# Patient Record
Sex: Female | Born: 1960 | Race: Black or African American | Hispanic: No | Marital: Married | Smoking: Current some day smoker
Health system: Southern US, Community
[De-identification: ages and names within clinical notes are randomized; demographics above are authoritative.]

## PROBLEM LIST (undated history)

## (undated) DIAGNOSIS — E559 Vitamin D deficiency, unspecified: Secondary | ICD-10-CM

## (undated) DIAGNOSIS — J309 Allergic rhinitis, unspecified: Secondary | ICD-10-CM

## (undated) DIAGNOSIS — E7800 Pure hypercholesterolemia, unspecified: Secondary | ICD-10-CM

## (undated) DIAGNOSIS — J45909 Unspecified asthma, uncomplicated: Secondary | ICD-10-CM

## (undated) DIAGNOSIS — E785 Hyperlipidemia, unspecified: Secondary | ICD-10-CM

## (undated) DIAGNOSIS — K219 Gastro-esophageal reflux disease without esophagitis: Secondary | ICD-10-CM

## (undated) DIAGNOSIS — F419 Anxiety disorder, unspecified: Secondary | ICD-10-CM

## (undated) DIAGNOSIS — E663 Overweight: Secondary | ICD-10-CM

## (undated) DIAGNOSIS — E039 Hypothyroidism, unspecified: Secondary | ICD-10-CM

## (undated) DIAGNOSIS — F32A Depression, unspecified: Secondary | ICD-10-CM

## (undated) HISTORY — DX: Overweight: E66.3

## (undated) HISTORY — DX: Hypothyroidism, unspecified: E03.9

## (undated) HISTORY — DX: Pure hypercholesterolemia, unspecified: E78.00

## (undated) HISTORY — DX: Allergic rhinitis, unspecified: J30.9

## (undated) HISTORY — DX: Hyperlipidemia, unspecified: E78.5

## (undated) HISTORY — PX: ABDOMINAL HYSTERECTOMY: SHX81

## (undated) HISTORY — PX: ECTOPIC PREGNANCY SURGERY: SHX613

## (undated) HISTORY — PX: COLONOSCOPY: SHX174

## (undated) HISTORY — DX: Unspecified asthma, uncomplicated: J45.909

## (undated) HISTORY — DX: Anxiety disorder, unspecified: F41.9

## (undated) HISTORY — PX: UTERINE FIBROID SURGERY: SHX826

## (undated) HISTORY — DX: Depression, unspecified: F32.A

## (undated) HISTORY — DX: Vitamin D deficiency, unspecified: E55.9

---

## 1998-11-23 HISTORY — PX: ECTOPIC PREGNANCY SURGERY: SHX613

## 2000-11-23 DIAGNOSIS — D219 Benign neoplasm of connective and other soft tissue, unspecified: Secondary | ICD-10-CM

## 2000-11-23 HISTORY — DX: Benign neoplasm of connective and other soft tissue, unspecified: D21.9

## 2015-05-13 ENCOUNTER — Emergency Department (INDEPENDENT_AMBULATORY_CARE_PROVIDER_SITE_OTHER)
Admission: EM | Admit: 2015-05-13 | Discharge: 2015-05-13 | Disposition: A | Discharge status: Home / Self Care | Payer: PRIVATE HEALTH INSURANCE | Source: Home / Self Care | Attending: Family Medicine | Admitting: Family Medicine

## 2015-05-13 ENCOUNTER — Encounter (HOSPITAL_COMMUNITY): Payer: Self-pay | Admitting: Emergency Medicine

## 2015-05-13 DIAGNOSIS — J40 Bronchitis, not specified as acute or chronic: Secondary | ICD-10-CM | POA: Diagnosis not present

## 2015-05-13 MED ORDER — IPRATROPIUM-ALBUTEROL 0.5-2.5 (3) MG/3ML IN SOLN
RESPIRATORY_TRACT | Status: AC
  Filled 2015-05-13: qty 3

## 2015-05-13 MED ORDER — ALBUTEROL SULFATE HFA 108 (90 BASE) MCG/ACT IN AERS: 2.0000 | INHALATION_SPRAY | Freq: Four times a day (QID) | RESPIRATORY_TRACT | Status: DC | PRN

## 2015-05-13 MED ORDER — PREDNISONE 50 MG PO TABS: 50.0000 mg | ORAL_TABLET | Freq: Every day | ORAL | Status: DC

## 2015-05-13 MED ORDER — GUAIFENESIN-CODEINE 100-10 MG/5ML PO SOLN: 5.0000 mL | Freq: Every evening | ORAL | Status: DC | PRN

## 2015-05-13 MED ORDER — IPRATROPIUM-ALBUTEROL 0.5-2.5 (3) MG/3ML IN SOLN
3.0000 mL | Freq: Once | RESPIRATORY_TRACT | Status: AC
  Administered 2015-05-13: 3 mL via RESPIRATORY_TRACT

## 2015-05-13 NOTE — Discharge Instructions (Signed)
Thank you for coming in today. Please quit smoking.  Call or go to the emergency room if you get worse, have trouble breathing, have chest pains, or palpitations.   Acute Bronchitis Bronchitis is inflammation of the airways that extend from the windpipe into the lungs (bronchi). The inflammation often causes mucus to develop. This leads to a cough, which is the most common symptom of bronchitis.  In acute bronchitis, the condition usually develops suddenly and goes away over time, usually in a couple weeks. Smoking, allergies, and asthma can make bronchitis worse. Repeated episodes of bronchitis may cause further lung problems.  CAUSES Acute bronchitis is most often caused by the same virus that causes a cold. The virus can spread from person to person (contagious) through coughing, sneezing, and touching contaminated objects. SIGNS AND SYMPTOMS  1. Cough.  2. Fever.  3. Coughing up mucus.  4. Body aches.  5. Chest congestion.  6. Chills.  7. Shortness of breath.  8. Sore throat.  DIAGNOSIS  Acute bronchitis is usually diagnosed through a physical exam. Your health care provider will also ask you questions about your medical history. Tests, such as chest X-rays, are sometimes done to rule out other conditions.  TREATMENT  Acute bronchitis usually goes away in a couple weeks. Oftentimes, no medical treatment is necessary. Medicines are sometimes given for relief of fever or cough. Antibiotic medicines are usually not needed but may be prescribed in certain situations. In some cases, an inhaler may be recommended to help reduce shortness of breath and control the cough. A cool mist vaporizer may also be used to help thin bronchial secretions and make it easier to clear the chest.  HOME CARE INSTRUCTIONS 1. Get plenty of rest.  2. Drink enough fluids to keep your urine clear or pale yellow (unless you have a medical condition that requires fluid restriction). Increasing fluids may help  thin your respiratory secretions (sputum) and reduce chest congestion, and it will prevent dehydration.  3. Take medicines only as directed by your health care provider. 4. If you were prescribed an antibiotic medicine, finish it all even if you start to feel better. 5. Avoid smoking and secondhand smoke. Exposure to cigarette smoke or irritating chemicals will make bronchitis worse. If you are a smoker, consider using nicotine gum or skin patches to help control withdrawal symptoms. Quitting smoking will help your lungs heal faster.  6. Reduce the chances of another bout of acute bronchitis by washing your hands frequently, avoiding people with cold symptoms, and trying not to touch your hands to your mouth, nose, or eyes.  7. Keep all follow-up visits as directed by your health care provider.  SEEK MEDICAL CARE IF: Your symptoms do not improve after 1 week of treatment.  SEEK IMMEDIATE MEDICAL CARE IF:  You develop an increased fever or chills.   You have chest pain.   You have severe shortness of breath.  You have bloody sputum.   You develop dehydration.  You faint or repeatedly feel like you are going to pass out.  You develop repeated vomiting.  You develop a severe headache. MAKE SURE YOU:   Understand these instructions.  Will watch your condition.  Will get help right away if you are not doing well or get worse. Document Released: 12/17/2004 Document Revised: 03/26/2014 Document Reviewed: 05/02/2013 [facility] Patient Information 2015 [place], [place]. This information is not intended to replace advice given to you by your health care provider. Make sure you discuss any questions  you have with your health care provider.    How to Use an Inhaler Proper inhaler technique is very important. Good technique ensures that the medicine reaches the lungs. Poor technique results in depositing the medicine on the tongue and back of the throat rather than in the airways. If you  do not use the inhaler with good technique, the medicine will not help you. STEPS TO FOLLOW IF USING AN INHALER WITHOUT AN EXTENSION TUBE 9. Remove the cap from the inhaler. 10. If you are using the inhaler for the first time, you will need to prime it. Shake the inhaler for 5 seconds and release four puffs into the air, away from your face. Ask your health care provider or pharmacist if you have questions about priming your inhaler. 11. Shake the inhaler for 5 seconds before each breath in (inhalation). 12. Position the inhaler so that the top of the canister faces up. 13. Put your index finger on the top of the medicine canister. Your thumb supports the bottom of the inhaler. 14. Open your mouth. 15. Either place the inhaler between your teeth and place your lips tightly around the mouthpiece, or hold the inhaler 1-2 inches away from your open mouth. If you are unsure of which technique to use, ask your health care provider. 16. Breathe out (exhale) normally and as completely as possible. 17. Press the canister down with your index finger to release the medicine. 18. At the same time as the canister is pressed, inhale deeply and slowly until your lungs are completely filled. This should take 4-6 seconds. Keep your tongue down. 19. Hold the medicine in your lungs for 5-10 seconds (10 seconds is best). This helps the medicine get into the small airways of your lungs. 20. Breathe out slowly, through pursed lips. Whistling is an example of pursed lips. 21. Wait at least 15-30 seconds between puffs. Continue with the above steps until you have taken the number of puffs your health care provider has ordered. Do not use the inhaler more than your health care provider tells you. 22. Replace the cap on the inhaler. 23. Follow the directions from your health care provider or the inhaler insert for cleaning the inhaler. STEPS TO FOLLOW IF USING AN INHALER WITH AN EXTENSION (SPACER) 8. Remove the cap from  the inhaler. 9. If you are using the inhaler for the first time, you will need to prime it. Shake the inhaler for 5 seconds and release four puffs into the air, away from your face. Ask your health care provider or pharmacist if you have questions about priming your inhaler. 10. Shake the inhaler for 5 seconds before each breath in (inhalation). 11. Place the open end of the spacer onto the mouthpiece of the inhaler. 12. Position the inhaler so that the top of the canister faces up and the spacer mouthpiece faces you. 13. Put your index finger on the top of the medicine canister. Your thumb supports the bottom of the inhaler and the spacer. 14. Breathe out (exhale) normally and as completely as possible. 15. Immediately after exhaling, place the spacer between your teeth and into your mouth. Close your lips tightly around the spacer. 16. Press the canister down with your index finger to release the medicine. 17. At the same time as the canister is pressed, inhale deeply and slowly until your lungs are completely filled. This should take 4-6 seconds. Keep your tongue down and out of the way. 18. Hold the medicine in your  lungs for 5-10 seconds (10 seconds is best). This helps the medicine get into the small airways of your lungs. Exhale. 19. Repeat inhaling deeply through the spacer mouthpiece. Again hold that breath for up to 10 seconds (10 seconds is best). Exhale slowly. If it is difficult to take this second deep breath through the spacer, breathe normally several times through the spacer. Remove the spacer from your mouth. 20. Wait at least 15-30 seconds between puffs. Continue with the above steps until you have taken the number of puffs your health care provider has ordered. Do not use the inhaler more than your health care provider tells you. 21. Remove the spacer from the inhaler, and place the cap on the inhaler. 22. Follow the directions from your health care provider or the inhaler insert  for cleaning the inhaler and spacer. If you are using different kinds of inhalers, use your quick relief medicine to open the airways 10-15 minutes before using a steroid if instructed to do so by your health care provider. If you are unsure which inhalers to use and the order of using them, ask your health care provider, nurse, or respiratory therapist. If you are using a steroid inhaler, always rinse your mouth with water after your last puff, then gargle and spit out the water. Do not swallow the water. AVOID:  Inhaling before or after starting the spray of medicine. It takes practice to coordinate your breathing with triggering the spray.  Inhaling through the nose (rather than the mouth) when triggering the spray. HOW TO DETERMINE IF YOUR INHALER IS FULL OR NEARLY EMPTY You cannot know when an inhaler is empty by shaking it. A few inhalers are now being made with dose counters. Ask your health care provider for a prescription that has a dose counter if you feel you need that extra help. If your inhaler does not have a counter, ask your health care provider to help you determine the date you need to refill your inhaler. Write the refill date on a calendar or your inhaler canister. Refill your inhaler 7-10 days before it runs out. Be sure to keep an adequate supply of medicine. This includes making sure it is not expired, and that you have a spare inhaler.  SEEK MEDICAL CARE IF:   Your symptoms are only partially relieved with your inhaler.  You are having trouble using your inhaler.  You have some increase in phlegm. SEEK IMMEDIATE MEDICAL CARE IF:   You feel little or no relief with your inhalers. You are still wheezing and are feeling shortness of breath or tightness in your chest or both.  You have dizziness, headaches, or a fast heart rate.  You have chills, fever, or night sweats.  You have a noticeable increase in phlegm production, or there is blood in the phlegm. MAKE SURE YOU:     Understand these instructions.  Will watch your condition.  Will get help right away if you are not doing well or get worse. Document Released: 11/06/2000 Document Revised: 08/30/2013 Document Reviewed: 06/08/2013 [facility] Patient Information 2015 [place], [place]. This information is not intended to replace advice given to you by your health care provider. Make sure you discuss any questions you have with your health care provider.

## 2015-05-13 NOTE — ED Notes (Signed)
Pt has been suffering from chest congestion and a cough for one week.  She has tried several OTC medications with no relief.  She denies any other symptoms.

## 2015-05-13 NOTE — ED Provider Notes (Signed)
Melanie Newton is a 54 y.o. female who presents to Urgent Care today for chest congestion. Patient notes a one-week history of cough congestion wheezing shortness of breath. She's tried some over-the-counter medicines which have not helped. No fevers chills nausea vomiting or diarrhea.   History reviewed. No pertinent past medical history. History reviewed. No pertinent past surgical history. History  Substance Use Topics  . Smoking status: Never Smoker   . Smokeless tobacco: Not on file  . Alcohol Use: Yes     Comment: occasional   patient smokes marijuana daily   ROS as above Medications: No current facility-administered medications for this encounter.   Current Outpatient Prescriptions  Medication Sig Dispense Refill  . albuterol (PROVENTIL HFA;VENTOLIN HFA) 108 (90 BASE) MCG/ACT inhaler Inhale 2 puffs into the lungs every 6 (six) hours as needed for wheezing or shortness of breath. 1 Inhaler 2  . guaiFENesin-codeine 100-10 MG/5ML syrup Take 5 mLs by mouth at bedtime as needed for cough. 120 mL 0  . predniSONE (DELTASONE) 50 MG tablet Take 1 tablet (50 mg total) by mouth daily. 5 tablet 0   No Known Allergies   Exam:  BP 147/105 mmHg  Pulse 70  Temp(Src) 98.9 F (37.2 C) (Oral)  SpO2 100% Gen: Well NAD HEENT: EOMI,  MMM Lungs: Normal work of breathing. Prolonged expiratory phase and wheezing bilaterally Heart: RRR no MRG Abd: NABS, Soft. Nondistended, Nontender Exts: Brisk capillary refill, warm and well perfused.   Patient was given a 2.5/0.5 mg DuoNeb nebulizer treatment, and felt better  No results found for this or any previous visit (from the past 24 hour(s)). No results found.  Assessment and Plan: 54 y.o. female with bronchitis. Treat with prednisone and albuterol and codeine cough syrup. Encouraged smoking cessation.  Discussed warning signs or symptoms. Please see discharge instructions. Patient expresses understanding.     Gregor Hams,  MD 05/13/15 2020

## 2016-05-28 ENCOUNTER — Other Ambulatory Visit: Payer: Self-pay | Admitting: Nurse Practitioner

## 2016-05-28 DIAGNOSIS — Z1231 Encounter for screening mammogram for malignant neoplasm of breast: Secondary | ICD-10-CM

## 2016-06-12 ENCOUNTER — Ambulatory Visit
Admission: RE | Admit: 2016-06-12 | Discharge: 2016-06-12 | Disposition: A | Discharge status: Home / Self Care | Payer: BLUE CROSS/BLUE SHIELD | Source: Ambulatory Visit | Attending: Nurse Practitioner | Admitting: Nurse Practitioner

## 2016-06-12 ENCOUNTER — Other Ambulatory Visit: Payer: Self-pay | Admitting: Nurse Practitioner

## 2016-06-12 DIAGNOSIS — Z1231 Encounter for screening mammogram for malignant neoplasm of breast: Secondary | ICD-10-CM | POA: Diagnosis present

## 2017-09-30 ENCOUNTER — Other Ambulatory Visit: Payer: Self-pay | Admitting: Nurse Practitioner

## 2017-09-30 DIAGNOSIS — Z1231 Encounter for screening mammogram for malignant neoplasm of breast: Secondary | ICD-10-CM

## 2017-11-17 ENCOUNTER — Ambulatory Visit
Admission: RE | Admit: 2017-11-17 | Discharge: 2017-11-17 | Disposition: A | Discharge status: Home / Self Care | Payer: BLUE CROSS/BLUE SHIELD | Source: Ambulatory Visit | Attending: Nurse Practitioner | Admitting: Nurse Practitioner

## 2017-11-17 DIAGNOSIS — Z1231 Encounter for screening mammogram for malignant neoplasm of breast: Secondary | ICD-10-CM | POA: Diagnosis present

## 2018-09-21 ENCOUNTER — Other Ambulatory Visit: Payer: Self-pay | Admitting: Nurse Practitioner

## 2018-09-21 DIAGNOSIS — Z1231 Encounter for screening mammogram for malignant neoplasm of breast: Secondary | ICD-10-CM

## 2018-11-21 ENCOUNTER — Ambulatory Visit
Admission: RE | Admit: 2018-11-21 | Discharge: 2018-11-21 | Disposition: A | Discharge status: Home / Self Care | Payer: BLUE CROSS/BLUE SHIELD | Source: Ambulatory Visit | Attending: Nurse Practitioner | Admitting: Nurse Practitioner

## 2018-11-21 DIAGNOSIS — Z1231 Encounter for screening mammogram for malignant neoplasm of breast: Secondary | ICD-10-CM | POA: Diagnosis present

## 2019-11-06 ENCOUNTER — Other Ambulatory Visit: Payer: Self-pay | Admitting: Nurse Practitioner

## 2019-11-06 DIAGNOSIS — Z1231 Encounter for screening mammogram for malignant neoplasm of breast: Secondary | ICD-10-CM

## 2020-02-02 ENCOUNTER — Ambulatory Visit
Admission: RE | Admit: 2020-02-02 | Discharge: 2020-02-02 | Disposition: A | Discharge status: Home / Self Care | Payer: BLUE CROSS/BLUE SHIELD | Source: Ambulatory Visit | Attending: Nurse Practitioner | Admitting: Nurse Practitioner

## 2020-02-02 DIAGNOSIS — Z1231 Encounter for screening mammogram for malignant neoplasm of breast: Secondary | ICD-10-CM | POA: Diagnosis not present

## 2020-07-25 ENCOUNTER — Telehealth: Payer: Self-pay | Admitting: General Practice

## 2020-07-25 NOTE — Telephone Encounter (Signed)
  Called patient to schedule an appointment in regards to a referral we received. *I was unable to leave voice mail. * Referral is attached for reference.

## 2020-09-06 ENCOUNTER — Other Ambulatory Visit: Payer: Self-pay

## 2020-09-06 ENCOUNTER — Ambulatory Visit (INDEPENDENT_AMBULATORY_CARE_PROVIDER_SITE_OTHER): Payer: PRIVATE HEALTH INSURANCE | Admitting: Obstetrics and Gynecology

## 2020-09-06 ENCOUNTER — Encounter: Payer: Self-pay | Admitting: Obstetrics and Gynecology

## 2020-09-06 ENCOUNTER — Other Ambulatory Visit (HOSPITAL_COMMUNITY)
Admission: RE | Admit: 2020-09-06 | Discharge: 2020-09-06 | Disposition: A | Discharge status: Home / Self Care | Payer: PRIVATE HEALTH INSURANCE | Source: Ambulatory Visit | Attending: Obstetrics and Gynecology | Admitting: Obstetrics and Gynecology

## 2020-09-06 VITALS — BP 93/61 | HR 71 | Ht 66.0 in | Wt 166.1 lb

## 2020-09-06 DIAGNOSIS — R102 Pelvic and perineal pain unspecified side: Secondary | ICD-10-CM

## 2020-09-06 DIAGNOSIS — D259 Leiomyoma of uterus, unspecified: Secondary | ICD-10-CM

## 2020-09-06 DIAGNOSIS — Z124 Encounter for screening for malignant neoplasm of cervix: Secondary | ICD-10-CM | POA: Insufficient documentation

## 2020-09-06 DIAGNOSIS — R87615 Unsatisfactory cytologic smear of cervix: Secondary | ICD-10-CM | POA: Diagnosis not present

## 2020-09-06 NOTE — Progress Notes (Signed)
New pt present for a referral for fibroid pain and issues. Pt c/o of sharp pain and tender/sorenenss in the abd area.

## 2020-09-06 NOTE — Progress Notes (Signed)
GYNECOLOGY CLINIC PROGRESS NOTE Subjective:    Melanie Newton is a 59 y.o. [phone] menopausal female who presents as a referral from PCP Margurite Auerbach, NP) with uterine fibroids and complaints of pelvic pain x 1 year. Patient states that she has known about her fibroids since 2002.  Utilized Depo Provera for management of her cycles until menopause. Has not had any issues with postmenopausal bleeding, however has noted some pelvic discomfort that has been ongoing for the past year. Pain radiates across the lower pelvis every few days. Manages by taking NSAIDs/Tylenol as needed. Also notes abdominal soreness if she sleeps on her stomach.    Gynecologic History: Current contraception: post menopausal status History of abnormal Pap smear: no.  She does report that last pap smear in August with annual exam by PCP was unsatisfactory and needed repeat.  Family history of uterine or ovarian cancer: no Regular self breast exam: yes History of abnormal mammogram: no Family history of breast cancer: no Last colonoscopy: 08/25/2017   OB History  Gravida Para Term Preterm AB Living  5 1 1   4 1   SAB TAB Ectopic Multiple Live Births  3   1   1     # Outcome Date GA Lbr Len/2nd Weight Sex Delivery Anes PTL Lv  5 Ectopic 2000     ECTOPIC     4 Term 06/25/84    M Vag-Spont EPI  LIV  3 SAB      SAB     2 SAB      SAB     1 SAB      SAB       Past Medical History:  Diagnosis Date  . Allergic rhinitis   . Anxiety   . Depression   . High cholesterol   . Hyperlipidemia   . Hypothyroidism   . Vitamin D deficiency     Past Surgical History:  Procedure Laterality Date  . ECTOPIC PREGNANCY SURGERY    . UTERINE FIBROID SURGERY      Family History  Problem Relation Age of Onset  . Breast cancer Cousin 42  . Breast cancer Maternal Aunt 58  . Lupus Other   . Pancreatic cancer Other   . Hypertension Other   . Diabetes Other     Social History   Socioeconomic History  .  Marital status: Married    Spouse name: Not on file  . Number of children: Not on file  . Years of education: Not on file  . Highest education level: Not on file  Occupational History  . Not on file  Tobacco Use  . Smoking status: Never Smoker  . Smokeless tobacco: Never Used  Vaping Use  . Vaping Use: Never used  Substance and Sexual Activity  . Alcohol use: Yes    Comment: occasional  . Drug use: No  . Sexual activity: Not Currently  Other Topics Concern  . Not on file  Social History Narrative  . Not on file   Social Determinants of Health   Financial Resource Strain:   . Difficulty of Paying Living Expenses: Not on file  Food Insecurity:   . Worried About Charity fundraiser in the Last Year: Not on file  . Ran Out of Food in the Last Year: Not on file  Transportation Needs:   . Lack of Transportation (Medical): Not on file  . Lack of Transportation (Non-Medical): Not on file  Physical Activity:   . Days of Exercise  per Week: Not on file  . Minutes of Exercise per Session: Not on file  Stress:   . Feeling of Stress : Not on file  Social Connections:   . Frequency of Communication with Friends and Family: Not on file  . Frequency of Social Gatherings with Friends and Family: Not on file  . Attends Religious Services: Not on file  . Active Member of Clubs or Organizations: Not on file  . Attends Archivist Meetings: Not on file  . Marital Status: Not on file  Intimate Partner Violence:   . Fear of Current or Ex-Partner: Not on file  . Emotionally Abused: Not on file  . Physically Abused: Not on file  . Sexually Abused: Not on file    Current Outpatient Medications on File Prior to Visit  Medication Sig Dispense Refill  . albuterol (PROVENTIL HFA;VENTOLIN HFA) 108 (90 BASE) MCG/ACT inhaler Inhale 2 puffs into the lungs every 6 (six) hours as needed for wheezing or shortness of breath. 1 Inhaler 2  . atorvastatin (LIPITOR) 40 MG tablet Take 40 mg by  mouth daily.    . citalopram (CELEXA) 20 MG tablet Take 20 mg by mouth daily. Take 1 and 1/2 tablet daily.    Marland Kitchen loratadine (CLARITIN) 10 MG tablet Take 10 mg by mouth daily as needed for allergies.     . montelukast (SINGULAIR) 10 MG tablet Take 10 mg by mouth at bedtime. (Patient not taking: Reported on 09/06/2020)     No current facility-administered medications on file prior to visit.    No Known Allergies    Review of Systems Constitutional: negative for chills, fatigue, fevers and sweats Eyes: negative for irritation, redness and visual disturbance Ears, nose, mouth, throat, and face: negative for hearing loss, nasal congestion, snoring and tinnitus Respiratory: negative for asthma, cough, sputum Cardiovascular: negative for chest pain, dyspnea, exertional chest pressure/discomfort, irregular heart beat, palpitations and syncope Gastrointestinal: negative for abdominal pain, change in bowel habits, nausea and vomiting Genitourinary: negative for abnormal menstrual periods, genital lesions, sexual problems and vaginal discharge, dysuria and urinary incontinence. Positive for pelvic pain and urinary urgency.  Integument/breast: negative for breast lump, breast tenderness and nipple discharge Hematologic/lymphatic: negative for bleeding and easy bruising Musculoskeletal:negative for back pain and muscle weakness Neurological: negative for dizziness, headaches, vertigo and weakness Endocrine: negative for diabetic symptoms including polydipsia, polyuria and skin dryness Allergic/Immunologic: negative for hay fever and urticaria      Objective:     BP 93/61   Pulse 71   Ht 5\' 6"  (1.676 m)   Wt 166 lb 1.6 oz (75.3 kg)   LMP  (LMP Unknown)   BMI 26.81 kg/m  General appearance: alert and no distress Abdomen: soft, non-tender; bowel sounds normal; no masses,  no organomegaly Pelvic: external genitalia normal, rectovaginal septum normal.  Vagina without discharge.  Cervix normal  appearing, no lesions and no motion tenderness.  Uterus mobile, nontender, enlarged (14-16 week sized, irregular contour).  Adnexae non-palpable, nontender bilaterally.  Extremities: extremities normal, atraumatic, no cyanosis or edema Neurologic: Grossly normal      Imaging (performed at [facility] in [place], 12/26/2018):  Impression:  1. The Lung bases are clear.  2. Liver, spleen, pancreas, adrenal glands, kidneys appear normal.  3. Enlarged uterus with multiple fibroids present. No adnexal masses.  No free fluid or abnormal fluid collections. 4. No evidence of inflammatory disease. No bowel abnormalitiy   Assessment:    Symptomatic uterine fibroids.  Pelvic pain  Unsatisfactory pap smear  Plan:   1. Symptomatic fibroids - patient with uterine enlargement, multiple fibroids noted on CT scan, however further deliniation needed.  Discussed further imaging with pelvic ultrasound and to reassess fibroids as it has been over a year since last imaging. Patient to return in 1-2 weeks for ultrasound.  2.  Discussed options for management of pelvic pain due to fibroids (expectant management vs UFE vs hysterectomy). Discussed risks and benefits of each. Patient opts for expectant management at this time, as she notes symptoms are manageable with OTC meds. 3. Patient reports recent unsatisfactory pap smear, repeat performed today.    A total of 30 minutes were spent face-to-face with the patient during the encounter with greater than 50% dealing with review of records and imaging, counseling and coordination of care.    Rubie Maid, MD [facility]

## 2020-09-06 NOTE — Patient Instructions (Signed)

## 2020-09-12 ENCOUNTER — Encounter: Payer: PRIVATE HEALTH INSURANCE | Admitting: Obstetrics and Gynecology

## 2020-09-13 LAB — CYTOLOGY - PAP
Comment: NEGATIVE
Diagnosis: NEGATIVE
High risk HPV: NEGATIVE

## 2020-09-20 ENCOUNTER — Encounter: Payer: PRIVATE HEALTH INSURANCE | Admitting: Obstetrics and Gynecology

## 2020-09-25 ENCOUNTER — Other Ambulatory Visit: Payer: Self-pay

## 2020-09-25 ENCOUNTER — Ambulatory Visit (INDEPENDENT_AMBULATORY_CARE_PROVIDER_SITE_OTHER): Payer: PRIVATE HEALTH INSURANCE

## 2020-09-25 DIAGNOSIS — R102 Pelvic and perineal pain: Secondary | ICD-10-CM | POA: Diagnosis not present

## 2020-09-25 DIAGNOSIS — D259 Leiomyoma of uterus, unspecified: Secondary | ICD-10-CM

## 2020-10-07 ENCOUNTER — Telehealth: Payer: Self-pay

## 2020-10-07 NOTE — Telephone Encounter (Signed)
Please schedule the pt for a televisit or an in office visit. Thanks [employer]

## 2020-10-07 NOTE — Telephone Encounter (Signed)
Patient called in stating that she would like to speak with her provider regarding getting her fibroids removed. Patient would like to speak with her provider regarding the next steps and what all that entails. Could you please advise?

## 2020-10-07 NOTE — Telephone Encounter (Signed)
Patient has been scheduled

## 2020-10-10 ENCOUNTER — Encounter: Payer: Self-pay | Admitting: Obstetrics and Gynecology

## 2020-10-10 ENCOUNTER — Other Ambulatory Visit: Payer: Self-pay

## 2020-10-10 ENCOUNTER — Ambulatory Visit (INDEPENDENT_AMBULATORY_CARE_PROVIDER_SITE_OTHER): Payer: PRIVATE HEALTH INSURANCE | Admitting: Obstetrics and Gynecology

## 2020-10-10 VITALS — BP 117/73 | HR 67 | Ht 66.0 in | Wt 164.8 lb

## 2020-10-10 DIAGNOSIS — D252 Subserosal leiomyoma of uterus: Secondary | ICD-10-CM

## 2020-10-10 DIAGNOSIS — R102 Pelvic and perineal pain: Secondary | ICD-10-CM

## 2020-10-10 DIAGNOSIS — D251 Intramural leiomyoma of uterus: Secondary | ICD-10-CM | POA: Diagnosis not present

## 2020-10-10 NOTE — Progress Notes (Signed)
GYNECOLOGY PROGRESS NOTE  Subjective:    Patient ID: Melanie Newton, female    DOB: Sep 23, 1961, 59 y.o.   MRN: [id number]  HPI  Patient is a 59 y.o. [id number] postmenopausal female who presents for discussion of surgical management of her fibroids. She notes she is still having some pain and discomfort, and thinks she is finally ready to proceed with hysterectomy.  She states that she has been dealing with fibroids for over 20 years, and has had various treatments including hormones (birth control, Depot Lupron, surgery with hysteroscopic resection). Is ready to finally be done with the fibroids.   The following portions of the patient's history were reviewed and updated as appropriate: allergies, current medications, past family history, past medical history, past social history, past surgical history and problem list.  Review of Systems Pertinent items noted in HPI and remainder of comprehensive ROS otherwise negative.   Objective:   Blood pressure 117/73, pulse 67, height 5\' 6"  (1.676 m), weight 164 lb 12.8 oz (74.8 kg). General appearance: alert and no distress See H&P for remainder of exam.     Imaging:  US PELVIS (TRANSABDOMINAL ONLY) Patient Name: Melanie Newton DOB: [birth year] MRN: [id number] ULTRASOUND REPORT  Location: [facility] Date of Service: 09/26/2020   Indications:Enlarged Uterus, history of fibroids  Findings:  The uterus is anteverted and measures 9.2 x 6.0 x 6.2 cm.. Echo texture is heterogenous with evidence of focal masses. Within the uterus are multiple suspected fibroids measuring: Fibroid 1:Posterior SS 3.4 x 3.6 x 3.6 cm Fibroid 2:Anterior SS 3.5 x 4.0 x 4.2 cm. Fibroid 3: Lower body IM 3.1 x 2.2 x 2.9 cm. Fibroid 4: Anterior IM 3.5 x 3.1 x 3.1 cm.  The Endometrium measures 5 mm.  Right Ovary not seen .  Left Ovary measures 2.8 x 2.6 x 2.8 cm  It is normal in  appearance.Hypoechoic lesion with smooth wall and good through    transmission measuring 2.1 x 1.7 x 1.6 cm. Survey of the adnexa demonstrates no adnexal masses. There is no free fluid in the cul de sac.  Impression: 1. Enlarge fibroid uterus. 2. Lt ovarian simple cyst.  Recommendations: 1.Clinical correlation with the patient's History and Physical Exam.  Jenine M. Albertine Grates    RDMS  I have reviewed this study and agree with documented findings.   Rubie Maid, MD [facility]   Assessment:   1. Intramural and subserous leiomyoma of uterus   2. Pelvic pain     Plan:   Patient desires definitive management with hysterectomy. I proposed doing a robotic-assisted total laparoscopic hysterectomy and prophylactic bilateral salpingectomy.  No indication for oophorectomy.  Patient agrees with this proposed surgery.  The risks of surgery were discussed in detail with the patient including but not limited to: bleeding which may require transfusion or reoperation; infection which may require antibiotics; injury to bowel, bladder, ureters or other surrounding organs; need for additional procedures including laparotomy or subsequent procedures secondary to abnormal pathology; formation of adhesions; thromboembolic phenomenon; incisional problems and other postoperative/anesthesia complications.  Patient was also advised that she will remain in house for up to 24 hours; and expected recovery time after a hysterectomy is 6-8 weeks.  Patient was told that the likelihood that her condition and symptoms will be treated effectively with this surgical management was very high; the postoperative expectations were also discussed in detail. The patient also understands the alternative treatment options which were discussed in full. All questions were answered.  She was told that she will be contacted by our surgical scheduler regarding the time and date of her surgery; routine preoperative instructions will be given to her by the preoperative nursing team.   She is  aware of need for preoperative COVID testing and subsequent quarantine from time of test to time of surgery; she will be given further preoperative instructions at that [facility] screening visit.   Routine postoperative instructions will be reviewed with the patient in detail after surgery. Printed patient education handouts about the procedure was given to the patient to review at home. Desires to have surgery soon.  Will tentatively schedule surgery for 10/25/2020.    A total of 15 minutes were spent face-to-face with the patient during this encounter and over half of that time dealt with counseling and coordination of care.   Rubie Maid, MD [facility]

## 2020-10-10 NOTE — Patient Instructions (Signed)
You are scheduled for surgery on 10/25/2020 for Robotic Total hysterectomy.  Nothing to eat after midnight on day prior to surgery.  Do not take any medications unless recommended by your provider on day prior to surgery.  Do not take NSAIDs (Motrin, Aleve) or aspirin 7 days prior to surgery.  You may take Tylenol products for minor aches and pains.  You will receive a prescription for pain medications post-operatively.  You will be contacted by phone approximately 1 week prior to surgery to schedule pre-operative appointment. Please call the office if you have any questions regarding your upcoming surgery.     Preparing for Surgery Preparing for surgery is an important part of your care. It can make things go more smoothly and help you avoid complications. The steps leading up to surgery may vary among hospitals. Follow all instructions given to you by your health care providers. Ask questions if you do not understand something. Talk about any concerns that you have. Here are some questions to consider asking before your surgery:  If my surgery is not an emergency (is elective), when would be the best time to have the surgery?  What arrangements do I need to make for work, home, or school?  What will my recovery be like? How long will it be before I can return to normal activities?  Will I need to prepare my home? Will I need to arrange care for me or my children?  Should I expect to have pain after surgery? What are my pain management options? Are there nonmedical options that I can try for pain? Tell a health care provider about:   Any allergies you have.  All medicines you are taking, including vitamins, herbs, eye drops, creams, and over-the-counter medicines.  Any problems you or family members have had with anesthetic medicines.  Any blood disorders you have.  Any surgeries you have had.  Any medical conditions you have.  Whether you are pregnant or may be pregnant. What  are the risks? The risks and complications of surgery depend on the specific procedure that you have. Discuss all the risks with your health care providers before your surgery. Ask about common surgical complications, which may include:  Infection.  Bleeding or a need for blood replacement (transfusion).  Allergic reactions to medicines.  Damage to surrounding nerves, tissues, or structures.  A blood clot.  Scarring.  Failure of the surgery to correct the problem. Follow these instructions before the procedure: Several days or weeks before your procedure  You may have a physical exam by your primary health care provider to make sure it is safe for you to have surgery.  You may have testing. This may include a chest X-ray, blood and urine tests, electrocardiogram (ECG), or other testing.  Ask your health care provider about: ? Changing or stopping your regular medicines. This is especially important if you are taking diabetes medicines or blood thinners. ? Taking medicines such as aspirin and ibuprofen. These medicines can thin your blood. Do not take these medicines unless your health care provider tells you to take them. ? Taking over-the-counter medicines, vitamins, herbs, and supplements.  Do not use any products that contain nicotine or tobacco, such as cigarettes and e-cigarettes. If you need help quitting, ask your health care provider.  Avoid alcohol, or limit your alcohol intake to no more than 1 drink a day for nonpregnant women and 2 drinks a day for men. One drink equals 12 oz of beer, 5 oz  of wine, or 1 oz of hard liquor.  Ask your health care provider if there are exercises you can do to prepare for surgery.  Eat a healthy diet. A healthy diet includes low-fat dairy products, low-fat (lean) meats, and fiber from whole grains, beans, and lots of fruits and vegetables.  Plan to have someone take you home from the hospital or clinic.  Plan to have a responsible adult  care for you for at least 24 hours after you leave the hospital or clinic. This is important. The day before your procedure  You may be given antibiotic medicine to take by mouth to help prevent infection. Take it as told by your health care provider.  You may be asked to shower with a germ-killing soap.  Follow instructions from your health care provider about eating and drinking restrictions. The day of your procedure  You may need to take another shower with a germ-killing soap before you leave home in the morning.  With a small sip of water, take only the medicines that you are told to take.  Do not wear any makeup, nail polish, powder, deodorant, lotion, jewelry, hair accessories, or anything on your skin or body except your clothes.  If you will be staying in the hospital, bring a case to hold your glasses, contacts, or dentures. You may also want to bring your robe and non-skid footwear.  If instructed by your health care provider, bring your sleep apnea device with you on the day of your surgery (if this applies to you).  Arrive at the hospital as scheduled.  Bring a friend or family member with you who can help to answer questions and be present while you meet with your health care provider. At the hospital  When you arrive at the hospital, you will likely: ? Go to the reception desk to notify staff that you have arrived. ? Move to the preoperative and preparation areas before going to the operating room.  You may have to wear compression stockings. These help to prevent blood clots and reduce swelling in your legs.  An IV may be inserted into one of your veins.  In the operating room, you may be given one or more of the following: ? A medicine to help you relax (sedative). ? A medicine to numb the area (local anesthetic). ? A medicine to make you fall asleep (general anesthetic). ? A medicine that is injected into an area of your body to numb everything below the  injection site (regional anesthetic).  Your surgical site will be marked or identified.  You may be given an antibiotic through your IV to help prevent infection. Contact a health care provider if you:  Develop a fever of more than 100.22F (38C) or other feelings of illness during the 48 hours before your surgery.  Have symptoms that get worse.  Have questions or concerns about your surgery. Summary  Preparing for surgery can make the procedure go more smoothly and lower your risk of complications.  Before surgery, make a list of questions and concerns to discuss with your surgeon. Ask about the risks and possible complications.  In the days or weeks before your surgery, follow all instructions from your health care provider. You may need to stop smoking, avoid alcohol, follow eating restrictions, and change or stop your regular medicines.  Contact your surgeon if you develop a fever or other signs of illness during the few days before your surgery. This information is not intended to  replace advice given to you by your health care provider. Make sure you discuss any questions you have with your health care provider. Document Revised: 11/12/2017 Document Reviewed: 09/14/2017 Elsevier Patient Education  2020 [employer].

## 2020-10-10 NOTE — Progress Notes (Signed)
Pt present to discuss fibroid removal/surgery. Pt stated that she is having pain and discomfort from the fibroids and would like to discuss options.

## 2020-10-13 ENCOUNTER — Encounter: Payer: Self-pay | Admitting: Obstetrics and Gynecology

## 2020-10-13 NOTE — H&P (View-Only) (Signed)
GYNECOLOGY PREOPERATIVE HISTORY AND PHYSICAL   Subjective:  Melanie Newton is a 59 y.o. [phone] here for surgical management of fibroid uterus and pelvic pain.  No significant preoperative concerns.  Proposed surgery: Robotic Total Hysterectomy with Bilateral Salpingectomy   Pertinent Gynecological History: Menses: post-menopausal Last mammogram: normal Date: 02/02/2020 Last pap: normal Date: 09/06/2020   OB History  Gravida Para Term Preterm AB Living  5 1 1   4 1   SAB TAB Ectopic Multiple Live Births  3   1   1     # Outcome Date GA Lbr Len/2nd Weight Sex Delivery Anes PTL Lv  5 Ectopic 2000     ECTOPIC     4 Term 06/25/84    M Vag-Spont EPI  LIV  3 SAB      SAB     2 SAB      SAB     1 SAB      SAB       Past Medical History:  Diagnosis Date  . Allergic rhinitis   . Anxiety   . Asthma   . Depression   . Fibroids 2002  . High cholesterol   . Hyperlipidemia   . Hypothyroidism   . Vitamin D deficiency     Past Surgical History:  Procedure Laterality Date  . ECTOPIC PREGNANCY SURGERY    . UTERINE FIBROID SURGERY      Family History  Problem Relation Age of Onset  . Breast cancer Cousin 2  . Breast cancer Maternal Aunt 58  . Lupus Other   . Pancreatic cancer Other   . Hypertension Other   . Diabetes Other   . Cancer Mother     Social History   Socioeconomic History  . Marital status: Married    Spouse name: Not on file  . Number of children: Not on file  . Years of education: Not on file  . Highest education level: Not on file  Occupational History  . Not on file  Tobacco Use  . Smoking status: Never Smoker  . Smokeless tobacco: Never Used  Vaping Use  . Vaping Use: Never used  Substance and Sexual Activity  . Alcohol use: Yes    Comment: occasional  . Drug use: No  . Sexual activity: Not Currently  Other Topics Concern  . Not on file  Social History Narrative  . Not on file   Social Determinants of Health   Financial  Resource Strain:   . Difficulty of Paying Living Expenses: Not on file  Food Insecurity:   . Worried About Charity fundraiser in the Last Year: Not on file  . Ran Out of Food in the Last Year: Not on file  Transportation Needs:   . Lack of Transportation (Medical): Not on file  . Lack of Transportation (Non-Medical): Not on file  Physical Activity:   . Days of Exercise per Week: Not on file  . Minutes of Exercise per Session: Not on file  Stress:   . Feeling of Stress : Not on file  Social Connections:   . Frequency of Communication with Friends and Family: Not on file  . Frequency of Social Gatherings with Friends and Family: Not on file  . Attends Religious Services: Not on file  . Active Member of Clubs or Organizations: Not on file  . Attends Archivist Meetings: Not on file  . Marital Status: Not on file  Intimate Partner Violence:   . Fear of  Current or Ex-Partner: Not on file  . Emotionally Abused: Not on file  . Physically Abused: Not on file  . Sexually Abused: Not on file    Current Outpatient Medications on File Prior to Visit  Medication Sig Dispense Refill  . albuterol (PROVENTIL HFA;VENTOLIN HFA) 108 (90 BASE) MCG/ACT inhaler Inhale 2 puffs into the lungs every 6 (six) hours as needed for wheezing or shortness of breath. 1 Inhaler 2  . atorvastatin (LIPITOR) 40 MG tablet Take 40 mg by mouth daily.    . citalopram (CELEXA) 20 MG tablet Take 20 mg by mouth daily. Take 1 and 1/2 tablet daily.    Marland Kitchen loratadine (CLARITIN) 10 MG tablet Take 10 mg by mouth daily as needed for allergies.     . montelukast (SINGULAIR) 10 MG tablet Take 10 mg by mouth at bedtime.      No current facility-administered medications on file prior to visit.   No Known Allergies    Review of Systems Constitutional: No recent fever/chills/sweats Respiratory: No recent cough/bronchitis Cardiovascular: No chest pain Gastrointestinal: No recent nausea/vomiting/diarrhea Genitourinary:  No UTI symptoms. Positive for pelvic pain Hematologic/lymphatic:No history of coagulopathy or recent blood thinner use    Objective:   Blood pressure 117/73, pulse 67, height 5\' 6"  (1.676 m), weight 164 lb 12.8 oz (74.8 kg). CONSTITUTIONAL: Well-developed, well-nourished female in no acute distress.  HENT:  Normocephalic, atraumatic, External right and left ear normal. Oropharynx is clear and moist EYES: Conjunctivae and EOM are normal. Pupils are equal, round, and reactive to light. No scleral icterus.  NECK: Normal range of motion, supple, no masses SKIN: Skin is warm and dry. No rash noted. Not diaphoretic. No erythema. No pallor. NEUROLOGIC: Alert and oriented to person, place, and time. Normal reflexes, muscle tone coordination. No cranial nerve deficit noted. PSYCHIATRIC: Normal mood and affect. Normal behavior. Normal judgment and thought content. CARDIOVASCULAR: Normal heart rate noted, regular rhythm RESPIRATORY: Effort and breath sounds normal, no problems with respiration noted ABDOMEN: Soft, nontender, nondistended. PELVIC: Deferred MUSCULOSKELETAL: Normal range of motion. No edema and no tenderness. 2+ distal pulses.    Labs: No results found for this or any previous visit (from the past 336 hour(s)).   Imaging Studies: US PELVIS (TRANSABDOMINAL ONLY)  Result Date: 09/29/2020 Patient Name: Melanie Newton DOB: 1961/01/30 MRN: [id number] ULTRASOUND REPORT Location: [facility] Date of Service: 09/26/2020 Indications:Enlarged Uterus, history of fibroids Findings: The uterus is anteverted and measures 9.2 x 6.0 x 6.2 cm.. Echo texture is heterogenous with evidence of focal masses. Within the uterus are multiple suspected fibroids measuring: Fibroid 1:Posterior SS 3.4 x 3.6 x 3.6 cm Fibroid 2:Anterior SS 3.5 x 4.0 x 4.2 cm. Fibroid 3: Lower body IM 3.1 x 2.2 x 2.9 cm. Fibroid 4: Anterior IM 3.5 x 3.1 x 3.1 cm. The Endometrium measures 5 mm. Right Ovary not seen .  Left Ovary measures 2.8 x 2.6 x 2.8 cm  It is normal in appearance.Hypoechoic lesion with smooth wall and good through transmission measuring 2.1 x 1.7 x 1.6 cm. Survey of the adnexa demonstrates no adnexal masses. There is no free fluid in the cul de sac. Impression: 1. Enlarge fibroid uterus. 2. Lt ovarian simple cyst. Recommendations: 1.Clinical correlation with the patient's History and Physical Exam. Jenine M. Albertine Grates    RDMS I have reviewed this study and agree with documented findings. Rubie Maid, MD [facility]   Assessment:   1. Intramural and subserous leiomyoma of uterus   2. Pelvic  pain     Plan:   Counseling: Procedure, risks, reasons, benefits and complications (including injury to bowel, bladder, major blood vessel, ureter, bleeding, possibility of transfusion, infection, or fistula formation) reviewed in detail. Likelihood of success in alleviating the patient's condition was discussed. Routine postoperative instructions will be reviewed with the patient and her family in detail after surgery.  The patient concurred with the proposed plan, giving informed written consent for the surgery.   Preop testing ordered. Instructions reviewed, including NPO after midnight.    Rubie Maid, MD [facility]

## 2020-10-13 NOTE — H&P (Signed)
GYNECOLOGY PREOPERATIVE HISTORY AND PHYSICAL   Subjective:  Melanie Newton is a 59 y.o. [phone] here for surgical management of fibroid uterus and pelvic pain.  No significant preoperative concerns.  Proposed surgery: Robotic Total Hysterectomy with Bilateral Salpingectomy   Pertinent Gynecological History: Menses: post-menopausal Last mammogram: normal Date: 02/02/2020 Last pap: normal Date: 09/06/2020   OB History  Gravida Para Term Preterm AB Living  5 1 1   4 1   SAB TAB Ectopic Multiple Live Births  3   1   1     # Outcome Date GA Lbr Len/2nd Weight Sex Delivery Anes PTL Lv  5 Ectopic 2000     ECTOPIC     4 Term 06/25/84    M Vag-Spont EPI  LIV  3 SAB      SAB     2 SAB      SAB     1 SAB      SAB       Past Medical History:  Diagnosis Date  . Allergic rhinitis   . Anxiety   . Asthma   . Depression   . Fibroids 2002  . High cholesterol   . Hyperlipidemia   . Hypothyroidism   . Vitamin D deficiency     Past Surgical History:  Procedure Laterality Date  . ECTOPIC PREGNANCY SURGERY    . UTERINE FIBROID SURGERY      Family History  Problem Relation Age of Onset  . Breast cancer Cousin 39  . Breast cancer Maternal Aunt 58  . Lupus Other   . Pancreatic cancer Other   . Hypertension Other   . Diabetes Other   . Cancer Mother     Social History   Socioeconomic History  . Marital status: Married    Spouse name: Not on file  . Number of children: Not on file  . Years of education: Not on file  . Highest education level: Not on file  Occupational History  . Not on file  Tobacco Use  . Smoking status: Never Smoker  . Smokeless tobacco: Never Used  Vaping Use  . Vaping Use: Never used  Substance and Sexual Activity  . Alcohol use: Yes    Comment: occasional  . Drug use: No  . Sexual activity: Not Currently  Other Topics Concern  . Not on file  Social History Narrative  . Not on file   Social Determinants of Health   Financial  Resource Strain:   . Difficulty of Paying Living Expenses: Not on file  Food Insecurity:   . Worried About Charity fundraiser in the Last Year: Not on file  . Ran Out of Food in the Last Year: Not on file  Transportation Needs:   . Lack of Transportation (Medical): Not on file  . Lack of Transportation (Non-Medical): Not on file  Physical Activity:   . Days of Exercise per Week: Not on file  . Minutes of Exercise per Session: Not on file  Stress:   . Feeling of Stress : Not on file  Social Connections:   . Frequency of Communication with Friends and Family: Not on file  . Frequency of Social Gatherings with Friends and Family: Not on file  . Attends Religious Services: Not on file  . Active Member of Clubs or Organizations: Not on file  . Attends Archivist Meetings: Not on file  . Marital Status: Not on file  Intimate Partner Violence:   . Fear of  Current or Ex-Partner: Not on file  . Emotionally Abused: Not on file  . Physically Abused: Not on file  . Sexually Abused: Not on file    Current Outpatient Medications on File Prior to Visit  Medication Sig Dispense Refill  . albuterol (PROVENTIL HFA;VENTOLIN HFA) 108 (90 BASE) MCG/ACT inhaler Inhale 2 puffs into the lungs every 6 (six) hours as needed for wheezing or shortness of breath. 1 Inhaler 2  . atorvastatin (LIPITOR) 40 MG tablet Take 40 mg by mouth daily.    . citalopram (CELEXA) 20 MG tablet Take 20 mg by mouth daily. Take 1 and 1/2 tablet daily.    Marland Kitchen loratadine (CLARITIN) 10 MG tablet Take 10 mg by mouth daily as needed for allergies.     . montelukast (SINGULAIR) 10 MG tablet Take 10 mg by mouth at bedtime.      No current facility-administered medications on file prior to visit.   No Known Allergies    Review of Systems Constitutional: No recent fever/chills/sweats Respiratory: No recent cough/bronchitis Cardiovascular: No chest pain Gastrointestinal: No recent nausea/vomiting/diarrhea Genitourinary:  No UTI symptoms. Positive for pelvic pain Hematologic/lymphatic:No history of coagulopathy or recent blood thinner use    Objective:   Blood pressure 117/73, pulse 67, height 5\' 6"  (1.676 m), weight 164 lb 12.8 oz (74.8 kg). CONSTITUTIONAL: Well-developed, well-nourished female in no acute distress.  HENT:  Normocephalic, atraumatic, External right and left ear normal. Oropharynx is clear and moist EYES: Conjunctivae and EOM are normal. Pupils are equal, round, and reactive to light. No scleral icterus.  NECK: Normal range of motion, supple, no masses SKIN: Skin is warm and dry. No rash noted. Not diaphoretic. No erythema. No pallor. NEUROLOGIC: Alert and oriented to person, place, and time. Normal reflexes, muscle tone coordination. No cranial nerve deficit noted. PSYCHIATRIC: Normal mood and affect. Normal behavior. Normal judgment and thought content. CARDIOVASCULAR: Normal heart rate noted, regular rhythm RESPIRATORY: Effort and breath sounds normal, no problems with respiration noted ABDOMEN: Soft, nontender, nondistended. PELVIC: Deferred MUSCULOSKELETAL: Normal range of motion. No edema and no tenderness. 2+ distal pulses.    Labs: No results found for this or any previous visit (from the past 336 hour(s)).   Imaging Studies: US PELVIS (TRANSABDOMINAL ONLY)  Result Date: 09/29/2020 Patient Name: Melanie Newton DOB: 01/08/1961 MRN: [id number] ULTRASOUND REPORT Location: [facility] Date of Service: 09/26/2020 Indications:Enlarged Uterus, history of fibroids Findings: The uterus is anteverted and measures 9.2 x 6.0 x 6.2 cm.. Echo texture is heterogenous with evidence of focal masses. Within the uterus are multiple suspected fibroids measuring: Fibroid 1:Posterior SS 3.4 x 3.6 x 3.6 cm Fibroid 2:Anterior SS 3.5 x 4.0 x 4.2 cm. Fibroid 3: Lower body IM 3.1 x 2.2 x 2.9 cm. Fibroid 4: Anterior IM 3.5 x 3.1 x 3.1 cm. The Endometrium measures 5 mm. Right Ovary not seen .  Left Ovary measures 2.8 x 2.6 x 2.8 cm  It is normal in appearance.Hypoechoic lesion with smooth wall and good through transmission measuring 2.1 x 1.7 x 1.6 cm. Survey of the adnexa demonstrates no adnexal masses. There is no free fluid in the cul de sac. Impression: 1. Enlarge fibroid uterus. 2. Lt ovarian simple cyst. Recommendations: 1.Clinical correlation with the patient's History and Physical Exam. Jenine M. Albertine Grates    RDMS I have reviewed this study and agree with documented findings. Rubie Maid, MD [facility]   Assessment:   1. Intramural and subserous leiomyoma of uterus   2. Pelvic  pain     Plan:   Counseling: Procedure, risks, reasons, benefits and complications (including injury to bowel, bladder, major blood vessel, ureter, bleeding, possibility of transfusion, infection, or fistula formation) reviewed in detail. Likelihood of success in alleviating the patient's condition was discussed. Routine postoperative instructions will be reviewed with the patient and her family in detail after surgery.  The patient concurred with the proposed plan, giving informed written consent for the surgery.   Preop testing ordered. Instructions reviewed, including NPO after midnight.    Rubie Maid, MD [facility]

## 2020-10-22 ENCOUNTER — Encounter
Admission: RE | Admit: 2020-10-22 | Discharge: 2020-10-22 | Disposition: A | Discharge status: Home / Self Care | Payer: PRIVATE HEALTH INSURANCE | Source: Ambulatory Visit | Attending: Obstetrics and Gynecology | Admitting: Obstetrics and Gynecology

## 2020-10-22 ENCOUNTER — Other Ambulatory Visit: Payer: Self-pay

## 2020-10-22 NOTE — Patient Instructions (Signed)
Your procedure is scheduled on: Friday 10-25-2020  Report to the Registration Desk on the 1st floor of the [facility]. To find out your arrival time, please call [phone] between 1PM - 3PM on: Thursday 10-24-2020  REMEMBER: Instructions that are not followed completely may result in serious medical risk, up to and including death; or upon the discretion of your surgeon and anesthesiologist your surgery may need to be rescheduled.  Do not eat food after midnight the night before surgery.  No gum chewing, lozengers or hard candies.  You may however, drink CLEAR liquids up to 2 hours before you are scheduled to arrive for your surgery. Do not drink anything within 2 hours of your scheduled arrival time.  Clear liquids include: - water  - apple juice without pulp - black coffee or tea (Do NOT add milk or creamers to the coffee or tea) Do NOT drink anything that is not on this list.  Type 1 and Type 2 diabetics should only drink water.  In addition, your doctor has ordered for you to drink the provided  Ensure Pre-Surgery Clear Carbohydrate Drink  Drinking this carbohydrate drink up to two hours before surgery helps to reduce insulin resistance and improve patient outcomes. Please complete drinking 2 hours prior to scheduled arrival time.  TAKE THESE MEDICATIONS THE MORNING OF SURGERY WITH A SIP OF WATER: NONE  Use inhalers on the day of surgery    One week prior to surgery: Stop Anti-inflammatories (NSAIDS) such as Advil, Aleve, Ibuprofen, Motrin, Naproxen, Naprosyn and ASPIRIN OR Aspirin based products such as Excedrin, Goodys Powder, BC Powder.  USE TYLENOL IF NEEDED Stop ANY OVER THE COUNTER supplements until after surgery. (However, you may continue taking Vitamin D, Vitamin B, and multivitamin up until the day before surgery.)  No Alcohol for 24 hours before or after surgery.  No Smoking including e-cigarettes for 24 hours prior to surgery.  No chewable tobacco products  for at least 6 hours prior to surgery.  No nicotine patches on the day of surgery.  Do not use any "recreational" drugs for at least a week prior to your surgery.  Please be advised that the combination of cocaine and anesthesia may have negative outcomes, up to and including death. If you test positive for cocaine, your surgery will be cancelled.  On the morning of surgery brush your teeth with toothpaste and water, you may rinse your mouth with mouthwash if you wish. Do not swallow any toothpaste or mouthwash.  Do not wear jewelry, make-up, hairpins, clips or nail polish.  Do not wear lotions, powders, or perfumes.   Do not shave body from the neck down 48 hours prior to surgery just in case you cut yourself which could leave a site for infection.  Also, freshly shaved skin may become irritated if using the CHG soap.  Contact lenses, hearing aids and dentures may not be worn into surgery.  Do not bring valuables to the hospital. [facility] is not responsible for any missing/lost belongings or valuables.   Use CHG Soap  as directed on instruction sheet.  Notify your doctor if there is any change in your medical condition (cold, fever, infection).  Wear comfortable clothing (specific to your surgery type) to the hospital.  Plan for stool softeners for home use; pain medications have a tendency to cause constipation. You can also help prevent constipation by eating foods high in fiber such as fruits and vegetables and drinking plenty of fluids as your diet  allows.  After surgery, you can help prevent lung complications by doing breathing exercises.  Take deep breaths and cough every 1-2 hours. Your doctor may order a device called an Incentive Spirometer to help you take deep breaths. When coughing or sneezing, hold a pillow firmly against your incision with both hands. This is called "splinting." Doing this helps protect your incision. It also decreases belly discomfort.  If you  are being admitted to the hospital overnight, [place].   If you are being discharged the day of surgery, you will not be allowed to drive home. You will need a responsible adult (18 years or older) to drive you home and stay with you that night.   If you are taking public transportation, you will need to have a responsible adult (18 years or older) with you. Please confirm with your physician that it is acceptable to use public transportation.   Please call the [facility] at [phone] if you have any questions about these instructions.  Visitation Policy:  Patients undergoing a surgery or procedure may have one family member or support person with them as long as that person is not COVID-19 positive or experiencing its symptoms.  That person may remain in the waiting area during the procedure.  Inpatient Visitation Update:   In an effort to ensure the safety of our team members and our patients, we are implementing a change to our visitation policy:  Effective Monday, Aug. 9, at 7 a.m., inpatients will be allowed one support person.  o The support person may change daily.  o The support person must pass our screening, gel in and out, and wear a mask at all times, including in the patient's room.  o Patients must also wear a mask when staff or their support person are in the room.  o Masking is required regardless of vaccination status.  Systemwide, no visitors 17 or younger.

## 2020-10-23 ENCOUNTER — Other Ambulatory Visit
Admission: RE | Admit: 2020-10-23 | Discharge: 2020-10-23 | Disposition: A | Discharge status: Home / Self Care | Payer: BLUE CROSS/BLUE SHIELD | Source: Ambulatory Visit | Attending: Obstetrics and Gynecology | Admitting: Obstetrics and Gynecology

## 2020-10-23 DIAGNOSIS — D252 Subserosal leiomyoma of uterus: Secondary | ICD-10-CM | POA: Diagnosis not present

## 2020-10-23 DIAGNOSIS — Z20822 Contact with and (suspected) exposure to covid-19: Secondary | ICD-10-CM | POA: Insufficient documentation

## 2020-10-23 DIAGNOSIS — Z79899 Other long term (current) drug therapy: Secondary | ICD-10-CM | POA: Diagnosis not present

## 2020-10-23 DIAGNOSIS — Z01812 Encounter for preprocedural laboratory examination: Secondary | ICD-10-CM | POA: Diagnosis present

## 2020-10-23 DIAGNOSIS — N736 Female pelvic peritoneal adhesions (postinfective): Secondary | ICD-10-CM | POA: Diagnosis not present

## 2020-10-23 DIAGNOSIS — D251 Intramural leiomyoma of uterus: Secondary | ICD-10-CM | POA: Diagnosis not present

## 2020-10-23 DIAGNOSIS — D259 Leiomyoma of uterus, unspecified: Secondary | ICD-10-CM | POA: Diagnosis present

## 2020-10-23 LAB — CBC
HCT: 40.9 % (ref 36.0–46.0)
Hemoglobin: 13.4 g/dL (ref 12.0–15.0)
MCH: 29.3 pg (ref 26.0–34.0)
MCHC: 32.8 g/dL (ref 30.0–36.0)
MCV: 89.3 fL (ref 80.0–100.0)
Platelets: 235 10*3/uL (ref 150–400)
RBC: 4.58 MIL/uL (ref 3.87–5.11)
RDW: 11.7 % (ref 11.5–15.5)
WBC: 4.5 10*3/uL (ref 4.0–10.5)
nRBC: 0 % (ref 0.0–0.2)

## 2020-10-23 LAB — SARS CORONAVIRUS 2 (TAT 6-24 HRS): SARS Coronavirus 2: NEGATIVE

## 2020-10-23 LAB — COMPREHENSIVE METABOLIC PANEL
ALT: 30 U/L (ref 0–44)
AST: 21 U/L (ref 15–41)
Albumin: 4.3 g/dL (ref 3.5–5.0)
Alkaline Phosphatase: 59 U/L (ref 38–126)
Anion gap: 9 (ref 5–15)
BUN: 12 mg/dL (ref 6–20)
CO2: 26 mmol/L (ref 22–32)
Calcium: 9.7 mg/dL (ref 8.9–10.3)
Chloride: 105 mmol/L (ref 98–111)
Creatinine, Ser: 0.7 mg/dL (ref 0.44–1.00)
GFR, Estimated: >60 mL/min (ref >60)
Glucose, Bld: 97 mg/dL (ref 70–99)
Potassium: 3.9 mmol/L (ref 3.5–5.1)
Sodium: 140 mmol/L (ref 135–145)
Total Bilirubin: 1.5 mg/dL — ABNORMAL HIGH (ref 0.3–1.2)
Total Protein: 7.7 g/dL (ref 6.5–8.1)

## 2020-10-23 LAB — TYPE AND SCREEN
ABO/RH(D): O POS
Antibody Screen: NEGATIVE

## 2020-10-23 LAB — RAPID HIV SCREEN (HIV 1/2 AB+AG)
HIV 1/2 Antibodies: NONREACTIVE
HIV-1 P24 Antigen - HIV24: NONREACTIVE

## 2020-10-23 LAB — HEPATITIS C ANTIBODY: HCV Ab: NONREACTIVE

## 2020-10-24 LAB — ABO/RH: ABO/RH(D): O POS

## 2020-10-24 LAB — RPR: RPR Ser Ql: NONREACTIVE

## 2020-10-24 MED ORDER — CEFAZOLIN SODIUM-DEXTROSE 2-4 GM/100ML-% IV SOLN
2.0000 g | INTRAVENOUS | Status: AC
  Administered 2020-10-25: 2 g via INTRAVENOUS

## 2020-10-24 MED ORDER — ORAL CARE MOUTH RINSE: 15.0000 mL | Freq: Once | OROMUCOSAL | Status: AC

## 2020-10-24 MED ORDER — FAMOTIDINE 20 MG PO TABS: 20.0000 mg | ORAL_TABLET | Freq: Once | ORAL | Status: AC

## 2020-10-24 MED ORDER — ACETAMINOPHEN 500 MG PO TABS: 1000.0000 mg | ORAL_TABLET | ORAL | Status: AC

## 2020-10-24 MED ORDER — GABAPENTIN 300 MG PO CAPS: 300.0000 mg | ORAL_CAPSULE | ORAL | Status: AC

## 2020-10-24 MED ORDER — CHLORHEXIDINE GLUCONATE 0.12 % MT SOLN: 15.0000 mL | Freq: Once | OROMUCOSAL | Status: AC

## 2020-10-24 MED ORDER — POVIDONE-IODINE 10 % EX SWAB: 2.0000 "application " | Freq: Once | CUTANEOUS | Status: DC

## 2020-10-25 ENCOUNTER — Other Ambulatory Visit: Payer: Self-pay

## 2020-10-25 ENCOUNTER — Ambulatory Visit: Payer: BLUE CROSS/BLUE SHIELD | Admitting: Registered Nurse

## 2020-10-25 ENCOUNTER — Encounter: Payer: Self-pay | Admitting: Obstetrics and Gynecology

## 2020-10-25 ENCOUNTER — Encounter
Admission: RE | Disposition: A | Discharge status: Home / Self Care | Payer: Self-pay | Source: Home / Self Care | Attending: Obstetrics and Gynecology

## 2020-10-25 ENCOUNTER — Ambulatory Visit
Admission: RE | Admit: 2020-10-25 | Discharge: 2020-10-25 | Disposition: A | Discharge status: Home / Self Care | Payer: BLUE CROSS/BLUE SHIELD | Attending: Obstetrics and Gynecology | Admitting: Obstetrics and Gynecology

## 2020-10-25 DIAGNOSIS — Z79899 Other long term (current) drug therapy: Secondary | ICD-10-CM | POA: Insufficient documentation

## 2020-10-25 DIAGNOSIS — D252 Subserosal leiomyoma of uterus: Secondary | ICD-10-CM | POA: Diagnosis not present

## 2020-10-25 DIAGNOSIS — R102 Pelvic and perineal pain: Secondary | ICD-10-CM

## 2020-10-25 DIAGNOSIS — D251 Intramural leiomyoma of uterus: Secondary | ICD-10-CM | POA: Insufficient documentation

## 2020-10-25 DIAGNOSIS — N736 Female pelvic peritoneal adhesions (postinfective): Secondary | ICD-10-CM | POA: Insufficient documentation

## 2020-10-25 DIAGNOSIS — Z90710 Acquired absence of both cervix and uterus: Secondary | ICD-10-CM

## 2020-10-25 HISTORY — PX: ROBOTIC ASSISTED TOTAL HYSTERECTOMY: SHX6085

## 2020-10-25 LAB — URINE DRUG SCREEN, QUALITATIVE (ARMC ONLY)
Amphetamines, Ur Screen: NOT DETECTED
Barbiturates, Ur Screen: NOT DETECTED
Benzodiazepine, Ur Scrn: NOT DETECTED
Cannabinoid 50 Ng, Ur ~~LOC~~: POSITIVE — AB
Cocaine Metabolite,Ur ~~LOC~~: NOT DETECTED
MDMA (Ecstasy)Ur Screen: NOT DETECTED
Methadone Scn, Ur: NOT DETECTED
Opiate, Ur Screen: NOT DETECTED
Phencyclidine (PCP) Ur S: NOT DETECTED
Tricyclic, Ur Screen: NOT DETECTED

## 2020-10-25 SURGERY — HYSTERECTOMY, TOTAL, ROBOT-ASSISTED
Anesthesia: General | Laterality: Right

## 2020-10-25 MED ORDER — ONDANSETRON HCL 4 MG/2ML IJ SOLN
INTRAMUSCULAR | Status: DC | PRN
  Administered 2020-10-25: 4 mg via INTRAVENOUS

## 2020-10-25 MED ORDER — SIMETHICONE 80 MG PO CHEW: 80.0000 mg | CHEWABLE_TABLET | Freq: Four times a day (QID) | ORAL | 1 refills | Status: DC | PRN

## 2020-10-25 MED ORDER — FAMOTIDINE 20 MG PO TABS
ORAL_TABLET | ORAL | Status: AC
  Administered 2020-10-25: 20 mg via ORAL
  Filled 2020-10-25: qty 1

## 2020-10-25 MED ORDER — LIDOCAINE HCL (CARDIAC) PF 100 MG/5ML IV SOSY
PREFILLED_SYRINGE | INTRAVENOUS | Status: DC | PRN
  Administered 2020-10-25: 80 mg via INTRAVENOUS

## 2020-10-25 MED ORDER — OXYCODONE-ACETAMINOPHEN 5-325 MG PO TABS
1.0000 | ORAL_TABLET | Freq: Four times a day (QID) | ORAL | 0 refills | Status: DC | PRN
Start: 2020-10-25 — End: 2020-10-29

## 2020-10-25 MED ORDER — HYDROMORPHONE HCL 1 MG/ML IJ SOLN
INTRAMUSCULAR | Status: AC
  Filled 2020-10-25: qty 1

## 2020-10-25 MED ORDER — DEXAMETHASONE SODIUM PHOSPHATE 10 MG/ML IJ SOLN
INTRAMUSCULAR | Status: DC | PRN
  Administered 2020-10-25: 10 mg via INTRAVENOUS

## 2020-10-25 MED ORDER — BUPIVACAINE LIPOSOME 1.3 % IJ SUSP
INTRAMUSCULAR | Status: AC
  Filled 2020-10-25: qty 20

## 2020-10-25 MED ORDER — EPHEDRINE SULFATE 50 MG/ML IJ SOLN
INTRAMUSCULAR | Status: DC | PRN
  Administered 2020-10-25: 10 mg via INTRAVENOUS
  Administered 2020-10-25: 5 mg via INTRAVENOUS
  Administered 2020-10-25 (×3): 10 mg via INTRAVENOUS
  Administered 2020-10-25: 5 mg via INTRAVENOUS

## 2020-10-25 MED ORDER — ROCURONIUM BROMIDE 10 MG/ML (PF) SYRINGE
PREFILLED_SYRINGE | INTRAVENOUS | Status: AC
  Filled 2020-10-25: qty 10

## 2020-10-25 MED ORDER — ONDANSETRON HCL 4 MG/2ML IJ SOLN
INTRAMUSCULAR | Status: AC
  Filled 2020-10-25: qty 2

## 2020-10-25 MED ORDER — EPHEDRINE 5 MG/ML INJ
INTRAVENOUS | Status: AC
  Filled 2020-10-25: qty 10

## 2020-10-25 MED ORDER — PHENYLEPHRINE HCL (PRESSORS) 10 MG/ML IV SOLN
INTRAVENOUS | Status: DC | PRN
  Administered 2020-10-25: 100 ug via INTRAVENOUS
  Administered 2020-10-25: 50 ug via INTRAVENOUS
  Administered 2020-10-25 (×2): 100 ug via INTRAVENOUS
  Administered 2020-10-25: 200 ug via INTRAVENOUS

## 2020-10-25 MED ORDER — BUPIVACAINE HCL (PF) 0.5 % IJ SOLN
INTRAMUSCULAR | Status: AC
  Filled 2020-10-25: qty 30

## 2020-10-25 MED ORDER — PROPOFOL 10 MG/ML IV BOLUS
INTRAVENOUS | Status: AC
  Filled 2020-10-25: qty 20

## 2020-10-25 MED ORDER — FENTANYL CITRATE (PF) 100 MCG/2ML IJ SOLN
INTRAMUSCULAR | Status: AC
  Filled 2020-10-25: qty 2

## 2020-10-25 MED ORDER — ACETAMINOPHEN 500 MG PO TABS
ORAL_TABLET | ORAL | Status: AC
  Administered 2020-10-25: 1000 mg via ORAL
  Filled 2020-10-25: qty 2

## 2020-10-25 MED ORDER — SUGAMMADEX SODIUM 200 MG/2ML IV SOLN
INTRAVENOUS | Status: DC | PRN
  Administered 2020-10-25 (×4): 50 mg via INTRAVENOUS

## 2020-10-25 MED ORDER — LIDOCAINE HCL (PF) 2 % IJ SOLN
INTRAMUSCULAR | Status: AC
  Filled 2020-10-25: qty 5

## 2020-10-25 MED ORDER — ONDANSETRON HCL 4 MG/2ML IJ SOLN: 4.0000 mg | Freq: Once | INTRAMUSCULAR | Status: DC | PRN

## 2020-10-25 MED ORDER — METHYLENE BLUE 0.5 % INJ SOLN
INTRAVENOUS | Status: AC
  Filled 2020-10-25: qty 10

## 2020-10-25 MED ORDER — LACTATED RINGERS IV SOLN: INTRAVENOUS | Status: DC

## 2020-10-25 MED ORDER — CEFAZOLIN SODIUM-DEXTROSE 2-4 GM/100ML-% IV SOLN
INTRAVENOUS | Status: AC
  Filled 2020-10-25: qty 100

## 2020-10-25 MED ORDER — IBUPROFEN 600 MG PO TABS: 600.0000 mg | ORAL_TABLET | Freq: Four times a day (QID) | ORAL | 1 refills | Status: DC | PRN

## 2020-10-25 MED ORDER — BUPIVACAINE LIPOSOME 1.3 % IJ SUSP
INTRAMUSCULAR | Status: DC | PRN
  Administered 2020-10-25: 20 mL

## 2020-10-25 MED ORDER — CHLORHEXIDINE GLUCONATE 0.12 % MT SOLN
OROMUCOSAL | Status: AC
  Administered 2020-10-25: 15 mL via OROMUCOSAL
  Filled 2020-10-25: qty 15

## 2020-10-25 MED ORDER — FENTANYL CITRATE (PF) 100 MCG/2ML IJ SOLN
25.0000 ug | INTRAMUSCULAR | Status: DC | PRN
  Administered 2020-10-25 (×3): 25 ug via INTRAVENOUS

## 2020-10-25 MED ORDER — ROCURONIUM BROMIDE 100 MG/10ML IV SOLN
INTRAVENOUS | Status: DC | PRN
  Administered 2020-10-25 (×3): 10 mg via INTRAVENOUS
  Administered 2020-10-25: 50 mg via INTRAVENOUS
  Administered 2020-10-25 (×3): 10 mg via INTRAVENOUS

## 2020-10-25 MED ORDER — OXYCODONE-ACETAMINOPHEN 5-325 MG PO TABS
1.0000 | ORAL_TABLET | Freq: Once | ORAL | Status: AC
  Administered 2020-10-25: 1 via ORAL

## 2020-10-25 MED ORDER — DEXAMETHASONE SODIUM PHOSPHATE 10 MG/ML IJ SOLN
INTRAMUSCULAR | Status: AC
  Filled 2020-10-25: qty 1

## 2020-10-25 MED ORDER — FENTANYL CITRATE (PF) 100 MCG/2ML IJ SOLN
INTRAMUSCULAR | Status: DC | PRN
  Administered 2020-10-25: 25 ug via INTRAVENOUS
  Administered 2020-10-25: 50 ug via INTRAVENOUS
  Administered 2020-10-25: 25 ug via INTRAVENOUS
  Administered 2020-10-25: 50 ug via INTRAVENOUS
  Administered 2020-10-25: 25 ug via INTRAVENOUS

## 2020-10-25 MED ORDER — SEVOFLURANE IN SOLN
RESPIRATORY_TRACT | Status: AC
  Filled 2020-10-25: qty 250

## 2020-10-25 MED ORDER — MIDAZOLAM HCL 5 MG/5ML IJ SOLN
INTRAMUSCULAR | Status: DC | PRN
  Administered 2020-10-25: 2 mg via INTRAVENOUS

## 2020-10-25 MED ORDER — PROPOFOL 10 MG/ML IV BOLUS
INTRAVENOUS | Status: DC | PRN
  Administered 2020-10-25: 160 mg via INTRAVENOUS

## 2020-10-25 MED ORDER — GABAPENTIN 300 MG PO CAPS
ORAL_CAPSULE | ORAL | Status: AC
  Administered 2020-10-25: 300 mg via ORAL
  Filled 2020-10-25: qty 1

## 2020-10-25 MED ORDER — FENTANYL CITRATE (PF) 100 MCG/2ML IJ SOLN
INTRAMUSCULAR | Status: AC
  Administered 2020-10-25: 25 ug via INTRAVENOUS
  Filled 2020-10-25: qty 2

## 2020-10-25 MED ORDER — BUPIVACAINE HCL 0.5 % IJ SOLN
INTRAMUSCULAR | Status: DC | PRN
  Administered 2020-10-25: 30 mL

## 2020-10-25 MED ORDER — OXYCODONE-ACETAMINOPHEN 5-325 MG PO TABS
ORAL_TABLET | ORAL | Status: AC
  Filled 2020-10-25: qty 1

## 2020-10-25 MED ORDER — DOCUSATE SODIUM 100 MG PO CAPS: 100.0000 mg | ORAL_CAPSULE | Freq: Two times a day (BID) | ORAL | 1 refills | Status: DC | PRN

## 2020-10-25 MED ORDER — LIDOCAINE HCL (PF) 1 % IJ SOLN
INTRAMUSCULAR | Status: AC
  Filled 2020-10-25: qty 30

## 2020-10-25 MED ORDER — MIDAZOLAM HCL 2 MG/2ML IJ SOLN
INTRAMUSCULAR | Status: AC
  Filled 2020-10-25: qty 2

## 2020-10-25 MED ORDER — BUPIVACAINE LIPOSOME 1.3 % IJ SUSP: 20.0000 mL | Freq: Once | INTRAMUSCULAR | Status: DC

## 2020-10-25 SURGICAL SUPPLY — 69 items
"PENCIL ELECTRO HAND CTR " (MISCELLANEOUS) IMPLANT
BAG URINE DRAIN 2000ML AR STRL (UROLOGICAL SUPPLIES) ×2 IMPLANT
BLADE SURG SZ11 CARB STEEL (BLADE) ×2 IMPLANT
CANISTER SUCT 1200ML W/VALVE (MISCELLANEOUS) ×2 IMPLANT
CATH FOLEY 2WAY  5CC 16FR (CATHETERS) ×1
CATH URTH 16FR FL 2W BLN LF (CATHETERS) ×1 IMPLANT
CHLORAPREP W/TINT 26 (MISCELLANEOUS) ×2 IMPLANT
COUNTER NEEDLE 20/40 LG (NEEDLE) ×1 IMPLANT
COVER TIP SHEARS 8 DVNC (MISCELLANEOUS) ×1 IMPLANT
COVER TIP SHEARS 8MM DA VINCI (MISCELLANEOUS) ×1
COVER WAND RF STERILE (DRAPES) ×2 IMPLANT
DEFOGGER SCOPE WARMER CLEARIFY (MISCELLANEOUS) ×2 IMPLANT
DERMABOND ADVANCED (GAUZE/BANDAGES/DRESSINGS) ×1
DERMABOND ADVANCED .7 DNX12 (GAUZE/BANDAGES/DRESSINGS) ×1 IMPLANT
DRAPE 3/4 80X56 (DRAPES) ×4 IMPLANT
DRAPE ARM DVNC X/XI (DISPOSABLE) ×3 IMPLANT
DRAPE COLUMN DVNC XI (DISPOSABLE) ×1 IMPLANT
DRAPE DA VINCI XI ARM (DISPOSABLE) ×3
DRAPE DA VINCI XI COLUMN (DISPOSABLE) ×1
ELECT REM PT RETURN 9FT ADLT (ELECTROSURGICAL) ×2
ELECTRODE REM PT RTRN 9FT ADLT (ELECTROSURGICAL) ×1 IMPLANT
FILTER LAP SMOKE EVAC STRL (MISCELLANEOUS) ×2 IMPLANT
GLOVE BIO SURGEON STRL SZ 6.5 (GLOVE) ×8 IMPLANT
GLOVE INDICATOR 7.0 STRL GRN (GLOVE) ×8 IMPLANT
GOWN STRL REUS W/ TWL LRG LVL3 (GOWN DISPOSABLE) ×4 IMPLANT
GOWN STRL REUS W/TWL LRG LVL3 (GOWN DISPOSABLE) ×4
GRASPER SUT TROCAR 14GX15 (MISCELLANEOUS) ×2 IMPLANT
IRRIGATION STRYKERFLOW (MISCELLANEOUS) ×1 IMPLANT
IRRIGATOR STRYKERFLOW (MISCELLANEOUS) ×2
IV NS 1000ML (IV SOLUTION) ×1
IV NS 1000ML BAXH (IV SOLUTION) ×1 IMPLANT
KIT IMAGING PINPOINTPAQ (MISCELLANEOUS) IMPLANT
KIT PINK PAD W/HEAD ARE REST (MISCELLANEOUS) ×2
KIT PINK PAD W/HEAD ARM REST (MISCELLANEOUS) ×1 IMPLANT
LABEL OR SOLS (LABEL) ×2 IMPLANT
MANIFOLD NEPTUNE II (INSTRUMENTS) ×2 IMPLANT
MANIPULATOR VCARE LG CRV RETR (MISCELLANEOUS) IMPLANT
MANIPULATOR VCARE SML CRV RETR (MISCELLANEOUS) IMPLANT
MANIPULATOR VCARE STD CRV RETR (MISCELLANEOUS) ×1 IMPLANT
NEEDLE VERESS 14GA 120MM (NEEDLE) ×2 IMPLANT
NS IRRIG 1000ML POUR BTL (IV SOLUTION) ×2 IMPLANT
OCCLUDER COLPOPNEUMO (BALLOONS) ×2 IMPLANT
PACK GYN LAPAROSCOPIC (MISCELLANEOUS) ×2 IMPLANT
PAD OB MATERNITY 4.3X12.25 (PERSONAL CARE ITEMS) ×1 IMPLANT
PAD PREP 24X41 OB/GYN DISP (PERSONAL CARE ITEMS) ×2 IMPLANT
PENCIL ELECTRO HAND CTR (MISCELLANEOUS) ×2 IMPLANT
SCISSORS METZENBAUM CVD 33 (INSTRUMENTS) ×1 IMPLANT
SEAL CANN UNIV 5-8 DVNC XI (MISCELLANEOUS) ×3 IMPLANT
SEAL XI 5MM-8MM UNIVERSAL (MISCELLANEOUS) ×3
SEALER VESSEL DA VINCI XI (MISCELLANEOUS) ×1
SEALER VESSEL EXT DVNC XI (MISCELLANEOUS) IMPLANT
SET CYSTO W/LG BORE CLAMP LF (SET/KITS/TRAYS/PACK) ×1 IMPLANT
SOLUTION ELECTROLUBE (MISCELLANEOUS) ×2 IMPLANT
SPONGE LAP 18X18 RF (DISPOSABLE) ×2 IMPLANT
SUT DVC VLOC 180 0 12IN GS21 (SUTURE) ×2
SUT MNCRL 4-0 (SUTURE) ×1
SUT MNCRL 4-0 27XMFL (SUTURE) ×1
SUT VIC AB 0 CT1 27 (SUTURE) ×2
SUT VIC AB 0 CT1 27XCR 8 STRN (SUTURE) IMPLANT
SUT VIC AB 2-0 CT1 27 (SUTURE) ×1
SUT VIC AB 2-0 CT1 TAPERPNT 27 (SUTURE) ×1 IMPLANT
SUT VICRYL 0 AB UR-6 (SUTURE) ×2 IMPLANT
SUTURE DVC VLC 180 0 12IN GS21 (SUTURE) ×1 IMPLANT
SUTURE MNCRL 4-0 27XMF (SUTURE) ×1 IMPLANT
SYR 10ML LL (SYRINGE) ×2 IMPLANT
SYR 50ML LL SCALE MARK (SYRINGE) ×2 IMPLANT
TROCAR ENDO BLADELESS 11MM (ENDOMECHANICALS) ×2 IMPLANT
TROCAR XCEL 12X100 BLDLESS (ENDOMECHANICALS) ×2 IMPLANT
TUBING EVAC SMOKE HEATED PNEUM (TUBING) ×2 IMPLANT

## 2020-10-25 NOTE — Anesthesia Preprocedure Evaluation (Signed)
Anesthesia Evaluation  Patient identified by MRN, date of birth, ID band Patient awake    Reviewed: Allergy & Precautions, NPO status , Patient's Chart, lab work & pertinent test results  History of Anesthesia Complications Negative for: history of anesthetic complications  Airway Mallampati: II       Dental   Pulmonary asthma (no inhalers for months) , neg sleep apnea, Not current smoker,           Cardiovascular (-) hypertension(-) Past MI and (-) CHF (-) dysrhythmias (-) Valvular Problems/Murmurs     Neuro/Psych neg Seizures Anxiety Depression    GI/Hepatic Neg liver ROS, neg GERD  ,  Endo/Other  neg diabetes  Renal/GU negative Renal ROS     Musculoskeletal   Abdominal   Peds  Hematology   Anesthesia Other Findings   Reproductive/Obstetrics                             Anesthesia Physical Anesthesia Plan  ASA: II  Anesthesia Plan: General   Post-op Pain Management:    Induction: Intravenous  PONV Risk Score and Plan: 3 and Ondansetron, Dexamethasone and Treatment may vary due to age or medical condition  Airway Management Planned: Oral ETT  Additional Equipment:   Intra-op Plan:   Post-operative Plan:   Informed Consent: I have reviewed the patients History and Physical, chart, labs and discussed the procedure including the risks, benefits and alternatives for the proposed anesthesia with the patient or authorized representative who has indicated his/her understanding and acceptance.       Plan Discussed with:   Anesthesia Plan Comments:         Anesthesia Quick Evaluation

## 2020-10-25 NOTE — Interval H&P Note (Signed)
History and Physical Interval Note:  10/25/2020 12:00 PM  Melanie Newton  has presented today for surgery, with the diagnosis of Intramural and subserous leiomyoma of uterus and pelvic pain.  The various methods of treatment have been discussed with the patient and family. After consideration of risks, benefits and other options for treatment, the patient has consented to  Procedure(s): XI ROBOTIC ASSISTED TOTAL HYSTERECTOMY and bilateral salpingectomy (Right) as a surgical intervention. The patient's history has been reviewed, patient examined, no change in status, stable for surgery.  I have reviewed the patient's chart and labs.  Questions were answered to the patient's satisfaction.     Rubie Maid, MD

## 2020-10-25 NOTE — Discharge Instructions (Signed)
Laparoscopically Assisted Vaginal Hysterectomy, Care After This sheet gives you information about how to care for yourself after your procedure. Your health care provider may also give you more specific instructions. If you have problems or questions, contact your health care provider. What can I expect after the procedure? After the procedure, it is common to have:  Soreness and numbness in your incision areas.  Abdominal pain. You will be given pain medicine to control it.  Vaginal bleeding and discharge. You will need to use a sanitary napkin after this procedure.  Sore throat from the breathing tube that was inserted during surgery. Follow these instructions at home: Medicines  Take over-the-counter and prescription medicines only as told by your health care provider.  Do not take aspirin or ibuprofen. These medicines can cause bleeding.  Do not drive or use heavy machinery while taking prescription pain medicine.  Do not drive for 24 hours if you were given a medicine to help you relax (sedative) during the procedure. Incision care   Follow instructions from your health care provider about how to take care of your incisions. Make sure you: ? Wash your hands with soap and water before you change your bandage (dressing). If soap and water are not available, use hand sanitizer. ? Change your dressing as told by your health care provider. ? Leave stitches (sutures), skin glue, or adhesive strips in place. These skin closures may need to stay in place for 2 weeks or longer. If adhesive strip edges start to loosen and curl up, you may trim the loose edges. Do not remove adhesive strips completely unless your health care provider tells you to do that.  Check your incision area every day for signs of infection. Check for: ? Redness, swelling, or pain. ? Fluid or blood. ? Warmth. ? Pus or a bad smell. Activity  Get regular exercise as told by your health care provider. You may be  told to take short walks every day and go farther each time.  Return to your normal activities as told by your health care provider. Ask your health care provider what activities are safe for you.  Do not douche, use tampons, or have sexual intercourse for at least 6 weeks, or until your health care provider gives you permission.  Do not lift anything that is heavier than 10 lb (4.5 kg), or the limit that your health care provider tells you, until he or she says that it is safe. General instructions  Do not take baths, swim, or use a hot tub until your health care provider approves. Take showers instead of baths.  Do not drive for 24 hours if you received a sedative.  Do not drive or operate heavy machinery while taking prescription pain medicine.  To prevent or treat constipation while you are taking prescription pain medicine, your health care provider may recommend that you: ? Drink enough fluid to keep your urine clear or pale yellow. ? Take over-the-counter or prescription medicines. ? Eat foods that are high in fiber, such as fresh fruits and vegetables, whole grains, and beans. ? Limit foods that are high in fat and processed sugars, such as fried and sweet foods.  Keep all follow-up visits as told by your health care provider. This is important. Contact a health care provider if:  You have signs of infection, such as: ? Redness, swelling, or pain around your incision sites. ? Fluid or blood coming from an incision. ? An incision that feels warm to the   touch. ? Pus or a bad smell coming from an incision.  Your incision breaks open.  Your pain medicine is not helping.  You feel dizzy or light-headed.  You have pain or bleeding when you urinate.  You have persistent nausea and vomiting.  You have blood, pus, or a bad-smelling discharge from your vagina. Get help right away if:  You have a fever.  You have severe abdominal pain.  You have chest pain.  You have  shortness of breath.  You faint.  You have pain, swelling, or redness in your leg.  You have heavy bleeding from your vagina. Summary  After the procedure, it is common to have abdominal pain and vaginal bleeding.  You should not drive or lift heavy objects until your health care provider says that it is safe.  Contact your health care provider if you have any symptoms of infection, excessive vaginal bleeding, nausea, vomiting, or shortness of breath. This information is not intended to replace advice given to you by your health care provider. Make sure you discuss any questions you have with your health care provider. Document Revised: 10/22/2017 Document Reviewed: 01/05/2017 Elsevier Patient Education  2020 [employer]   1) The drugs that you were given will stay in your system until tomorrow so for the next 24 hours you should not:  A) Drive an automobile B) Make any legal decisions C) Drink any alcoholic beverage   2) You may resume regular meals tomorrow.  Today it is better to start with liquids and gradually work up to solid foods.  You may eat anything you prefer, but it is better to start with liquids, then soup and crackers, and gradually work up to solid foods.   3) Please notify your doctor immediately if you have any unusual bleeding, trouble breathing, redness and pain at the surgery site, drainage, fever, or pain not relieved by medication.  4) Your post-operative visit with Dr.                                     is: Date:                        Time:    Please call to schedule your post-operative visit.  5) Additional Instructions:  Bupivacaine Liposomal Suspension for Injection What is this medicine? BUPIVACAINE LIPOSOMAL (bue PIV a kane LIP oh som al) is an anesthetic. It causes loss of feeling in the skin or other tissues. It is used to prevent and to treat pain from some procedures. This medicine may  be used for other purposes; ask your health care provider or pharmacist if you have questions. COMMON BRAND NAME(S): EXPAREL What should I tell my health care provider before I take this medicine? They need to know if you have any of these conditions:  G6PD deficiency  heart disease  kidney disease  liver disease  low blood pressure  lung or breathing disease, like asthma  an unusual or allergic reaction to bupivacaine, other medicines, foods, dyes, or preservatives  pregnant or trying to get pregnant  breast-feeding How should I use this medicine? This medicine is for injection into the affected area. It is given by a health care professional in a hospital or clinic setting. Talk to your pediatrician regarding the use of this medicine in children. Special care may  be needed. Overdosage: If you think you have taken too much of this medicine contact a poison control center or emergency room at once. NOTE: This medicine is only for you. Do not share this medicine with others. What if I miss a dose? This does not apply. What may interact with this medicine? This medicine may interact with the following medications:  acetaminophen  certain antibiotics like dapsone, nitrofurantoin, aminosalicylic acid, sulfonamides  certain medicines for seizures like phenobarbital, phenytoin, valproic acid  chloroquine  cyclophosphamide  flutamide  hydroxyurea  ifosfamide  metoclopramide  nitric oxide  nitroglycerin  nitroprusside  nitrous oxide  other local anesthetics like lidocaine, pramoxine, tetracaine  primaquine  quinine  rasburicase  sulfasalazine This list may not describe all possible interactions. Give your health care provider a list of all the medicines, herbs, non-prescription drugs, or dietary supplements you use. Also tell them if you smoke, drink alcohol, or use illegal drugs. Some items may interact with your medicine. What should I watch for while  using this medicine? Your condition will be monitored carefully while you are receiving this medicine. Be careful to avoid injury while the area is numb, and you are not aware of pain. What side effects may I notice from receiving this medicine? Side effects that you should report to your doctor or health care professional as soon as possible:  allergic reactions like skin rash, itching or hives, swelling of the face, lips, or tongue  seizures  signs and symptoms of a dangerous change in heartbeat or heart rhythm like chest pain; dizziness; fast, irregular heartbeat; palpitations; feeling faint or lightheaded; falls; breathing problems  signs and symptoms of methemoglobinemia such as pale, gray, or blue colored skin; headache; fast heartbeat; shortness of breath; feeling faint or lightheaded, falls; tiredness Side effects that usually do not require medical attention (report to your doctor or health care professional if they continue or are bothersome):  anxious  back pain  changes in taste  changes in vision  constipation  dizziness  fever  nausea, vomiting This list may not describe all possible side effects. Call your doctor for medical advice about side effects. You may report side effects to FDA at 1-800-FDA-1088. Where should I keep my medicine? This drug is given in a hospital or clinic and will not be stored at home. NOTE: This sheet is a summary. It may not cover all possible information. If you have questions about this medicine, talk to your doctor, pharmacist, or health care provider.  2020 Elsevier/Gold Standard (2019-08-22 10:48:23)

## 2020-10-25 NOTE — Anesthesia Procedure Notes (Signed)
Procedure Name: Intubation Date/Time: 10/25/2020 12:26 PM Performed by: Debe Coder, CRNA Pre-anesthesia Checklist: Patient identified, Emergency Drugs available, Suction available and Patient being monitored Patient Re-evaluated:Patient Re-evaluated prior to induction Oxygen Delivery Method: Circle system utilized Preoxygenation: Pre-oxygenation with 100% oxygen Induction Type: IV induction Ventilation: Mask ventilation without difficulty Laryngoscope Size: Mac and 4 Grade View: Grade I Tube type: Oral Tube size: 7.0 mm Number of attempts: 1 Airway Equipment and Method: Stylet and Oral airway Placement Confirmation: ETT inserted through vocal cords under direct vision,  positive ETCO2 and breath sounds checked- equal and bilateral Secured at: 21 cm Tube secured with: Tape Dental Injury: Teeth and Oropharynx as per pre-operative assessment

## 2020-10-25 NOTE — Op Note (Signed)
Procedure(s): XI ROBOTIC ASSISTED  VAGINAL HYSTERECTOMY and Bilateral Salpingectomy Procedure Note  Melanie Newton female 59 y.o. 10/25/2020  Indications: The patient is a 59 y.o. [id number] female with fibroid uterus, pelvic pain   Pre-operative Diagnosis: fibroid uterus, pelvic pain, prior abdominal surgery (ectopic pregnancy and myomectomy x 2)  Post-operative Diagnosis: Same, with abdominal adhesions   Surgeon: Rubie Maid, MD  Assistants: Surgical scrub assist.   Anesthesia: General endotracheal anesthesia  Findings: The uterus was sounded to 8.5 cm. Multiple intramural and subserosal fibroids present. Fallopian tubes and ovaries appeared normal.  Several thick adhesion bands of the omentum to the anterior abdominal wall.   Procedure Details: The patient was seen in the Holding Room. The risks, benefits, complications, treatment options, and expected outcomes were discussed with the patient.  The patient concurred with the proposed plan, giving informed consent.  The site of surgery properly noted/marked. The patient was taken to the Operating Room, identified as Melanie Newton and the procedure verified as Procedure(s) (LRB): XI ROBOTIC ASSISTED TOTAL HYSTERECTOMY and Bilateral salpingectomy. A Time Out was held and the above information confirmed.  She was then placed under general anesthesia without difficulty. She was placed in the dorsal lithotomy position, and was prepped and draped in a sterile manner.  A foley catheter was placed and allowed to drain to gravity.  A sterile speculum was placed into the vagina.  The cervix was grasped with a single-tooth tenaculum and the uterus was sounded to 8.5 cm. The balloon manipulator was then properly placed. The balloon was filled to approximately 3 cc of saline. The cervical cup was placed around the cervix. A vaginal  balloon occluder a lap pad was then placed inside the vagina to help with pneumoperitoneum.    Attention  was then turned to the abdomen, where  an 8 mm incision was made supraumubilcally.  The Veress needle was passed and a pneumoperitoneum was established.  The Veress needle was then removed and an 8 mm port was placed supraumbilically.  The daVinci camera was then placed supraumbilically. Three more ports were then placed. There were two 8 mm ports that were placed 10 cm laterally to the umbilicus and 2 cm inferiorly on either side.  The 11 mm assistant port was then placed in the right lower quadrant 2 cm medial and superiorr to the iliac crest. The daVinci robot was then docked in the normal fashion. The patient was placed in steep Trendelenburg positioning.  Inspection of the pelvis showed an enlarged uterus with multiple fibroids, normal ovaries and tubes. Uterine perforation was visualized through the anterior surface of the uterus directly in front of a fundal fibroid, and the V-care manipulator was withdrawn through the perforation and repositioned.  Tehre were several thick adhesion bands of the omentum to the anterior abdominal wall.  The adhesions were ligated using the vessel sealer device. The right mesosalpinx of the fallopian tube was cauterized and cut using the vessel sealer device.   The utero-ovarian ligament was also coagulated and cut. The round ligament was coagulated and cut. A bladder flap was created and the bladder was dissected down from the cervix.This entire procedure was then repeated on the left side.   Due to the location of the uterine fibroids, it was difficult to visualize the uterine vessels.  Also there was some bleeding encountered at the base of the cervix.  It was difficult to cauterize using the vessel sealer or the bipolar fenestrated grasper.  As the suspensory ligaments of the  uterus were cut, the uterus was more difficult to manipulate, and could not be placed in a proper position to continue the case or identify the source of bleeding.  The decision was made to  complete the hysterectomy vaginally.   The V-care manipulator was removed from the uterus.  Attention was then turned to her pelvis.  A weighted speculum was then placed in the vagina, and the anterior and posterior lips of the cervix were grasped bilaterally with tenaculums. The cervix was then circumferentially incised using the bovie, and the posterior cul-de-sac was entered sharply without difficulty.  A long weighted speculum was inserted into the posterior cul-de-sac. The Heaney clamp was then used to clamp the uterosacral ligaments on either side.  They were then cut and sutured ligated with 0 Vicryl, and were held with a tag for later identification. Of note, all sutures used in this case were 0 Vicryl unless otherwise noted.   The cardinal ligaments were then clamped, cut and ligated bilaterally.  At this point, full entry into the anterior cul-de-sac was made, no injury to the bladder was noted.  The uterus, tubes were then delivered and sent to pathology.  A myomectomy was performed to remove the 2 posterior fibroids in order to deliver the uterus. Due to the long length of the vaginal canal and narrow introitus, the decision was made to close the vaginal cuff laparoscopically.    Attention was then returned to the abdomen and the camera and instruments were inserted.  The vaginal cuff was closed with a running suture of 0 Vicryl V-lock. The ureters were identified bilaterally. A survey of the pelvis was performed after thorough irrigation. The pelvis was noted to be hemostatic.  Intercede was placed over the vaginal cuff.  The right assistant site was closed with a suture of -0 Vicryl in a figure-of eight manner using the PMI device. The 4 incisions were injected with a total of 50 ml of 1.3% Exparel (20 ml) mixed with 30 ml of 0.5% Bupivicaine solution. The skin was closed with 4-0 Monocryl using subcuticular stitches. Dermabond was placed over all incisions.  The final needle, sponge, and  instrument count was correct. The patient tolerated the procedure well. Patient to the recovery room in good condition.     Estimated Blood Loss:  460 ml      Drains: foley catheterization prior to procedure with  250 ml of clear urine         Total IV Fluids:  1100 ml  Specimens: Uterus with cervix, bilateral fallopian tubes, and 2 solitary fibroids (from myomectomy)         Implants: None         Complications:  None; patient tolerated the procedure well.         Disposition: PACU - hemodynamically stable.         Condition: stable   Rubie Maid, MD [facility]

## 2020-10-26 ENCOUNTER — Encounter: Payer: Self-pay | Admitting: Obstetrics and Gynecology

## 2020-10-26 NOTE — Transfer of Care (Signed)
Immediate Anesthesia Transfer of Care Note  Patient: Melanie Newton  Procedure(s) Performed: XI ROBOTIC ASSISTED TOTAL HYSTERECTOMY and bilateral salpingectomy; Vaginal hysterectomy (Right )  Patient Location: PACU  Anesthesia Type:General  Level of Consciousness: awake, alert  and oriented  Airway & Oxygen Therapy: Patient Spontanous Breathing  Post-op Assessment: Report given to RN  Post vital signs: Reviewed and stable  Last Vitals:  Vitals Value Taken Time  BP 123/84 10/25/20 2000  Temp 37.1 C 10/25/20 2015  Pulse 83 10/25/20 2000  Resp 22 10/25/20 2015  SpO2 100 % 10/25/20 2000    Last Pain:  Vitals:   10/25/20 2015  TempSrc:   PainSc: 2          Complications: No complications documented.

## 2020-10-26 NOTE — Anesthesia Postprocedure Evaluation (Signed)
Anesthesia Post Note  Patient: Melanie Newton  Procedure(s) Performed: XI ROBOTIC ASSISTED TOTAL HYSTERECTOMY and bilateral salpingectomy; Vaginal hysterectomy (Right )  Patient location during evaluation: PACU Anesthesia Type: General Level of consciousness: awake and alert and oriented Pain management: pain level controlled Vital Signs Assessment: post-procedure vital signs reviewed and stable Respiratory status: spontaneous breathing Cardiovascular status: blood pressure returned to baseline Anesthetic complications: no   No complications documented.   Last Vitals:  Vitals:   10/25/20 2000 10/25/20 2015  BP: 123/84   Pulse: 83   Resp:  (!) 22  Temp:  37.1 C  SpO2: 100%     Last Pain:  Vitals:   10/25/20 2015  TempSrc:   PainSc: 2                  Sondos Wolfman

## 2020-10-28 ENCOUNTER — Telehealth: Payer: Self-pay

## 2020-10-28 NOTE — Telephone Encounter (Signed)
Spoke to pt concerning her call to the office. Pt stated that she was taking the ibuprofen 600 mg every 6 hours and taking an oxycodone every 6 hours alter between the two. Pt stated that she was still in a lot of pain. Pt was questioning if she could take 1 1/2 ibuprofen 600mg  every 6 hours instead of 1.

## 2020-10-28 NOTE — Telephone Encounter (Signed)
Pt called in and stated that she has some questions about the medications that she was prescribed. The pt is requesting a call back.  Please advise

## 2020-10-29 ENCOUNTER — Other Ambulatory Visit: Payer: Self-pay

## 2020-10-29 ENCOUNTER — Encounter: Payer: Self-pay | Admitting: Obstetrics and Gynecology

## 2020-10-29 ENCOUNTER — Ambulatory Visit (INDEPENDENT_AMBULATORY_CARE_PROVIDER_SITE_OTHER): Payer: PRIVATE HEALTH INSURANCE | Admitting: Obstetrics and Gynecology

## 2020-10-29 VITALS — BP 137/83 | HR 82 | Ht 65.0 in | Wt 162.1 lb

## 2020-10-29 DIAGNOSIS — Z90710 Acquired absence of both cervix and uterus: Secondary | ICD-10-CM

## 2020-10-29 MED ORDER — OXYCODONE HCL 5 MG PO TABS
5.0000 mg | ORAL_TABLET | Freq: Four times a day (QID) | ORAL | 0 refills | Status: DC | PRN
Start: 2020-10-29 — End: 2023-05-18

## 2020-10-29 NOTE — Progress Notes (Signed)
Pt present for post op visit. Pt called yesterday stating that she was in a pain and having cutting burning feeling in the incision area.

## 2020-10-29 NOTE — Progress Notes (Signed)
    OBSTETRICS/GYNECOLOGY POST-OPERATIVE CLINIC VISIT  Subjective:     Melanie Newton is a 59 y.o. [phone] female who presents to the clinic 5 days status post laparoscopic (robotic) assisted vaginal hysterectomy for fibroids and pelvic pain. Eating a regular diet without difficulty (eating light meals). Bowel movements are normal. Pain is controlled with current analgesics. Medications being used: prescription NSAID's including ibuprofen (Motrin) and narcotic analgesics including oxycodone/acetaminophen (Percocet, Tylox).  States that this is the first day that she has not had significant pain.  Has run out of her pain medication last night.  Also reports bleeding from one of her incisions yesterday, has resolved today after placing gauze over the area.   The following portions of the patient's history were reviewed and updated as appropriate: allergies, current medications, past family history, past medical history, past social history, past surgical history and problem list.  Review of Systems Pertinent items noted in HPI and remainder of comprehensive ROS otherwise negative.    Objective:    BP 137/83   Pulse 82   Ht 5\' 5"  (1.651 m)   Wt 162 lb 1.6 oz (73.5 kg)   LMP  (LMP Unknown)   BMI 26.97 kg/m  General:  alert and no distress  Abdomen: soft, bowel sounds active, non-tender  Incision:   healing well, no drainage, no erythema, no hernia, no seroma, no swelling, no dehiscence, incision well approximated. Dermabond over all incisions.     Pathology:  Pathology pending.   Assessment:    Doing well postoperatively.  S/p LAVH (robotic-assisted) with bilateral salpingectomy  Plan:   1. Continue any current medications. Given small supply on pain medication, changed from Percocet to Roxicodone so patient is able to take more Tylenol. Advised that if no further moderate-severe pain, does not have to pick up prescription. Encouraged continuation of use of Ibuprofen 600 mg and  can take up to 1000 mg of Tylenol alternating.  2. Wound care discussed. 3. Operative findings again reviewed. Pathology report pending. 4. Activity restrictions: no bending, stooping, or squatting, no lifting more than 10-15 pounds and pelvic rest 5. Anticipated return to work: 2 weeks. 6. Follow up: 5 weeks for post-operative visit.    Rubie Maid, MD [facility]

## 2020-10-31 LAB — SURGICAL PATHOLOGY

## 2020-11-08 ENCOUNTER — Encounter: Payer: PRIVATE HEALTH INSURANCE | Admitting: Obstetrics and Gynecology

## 2020-11-23 DIAGNOSIS — U071 COVID-19: Secondary | ICD-10-CM | POA: Insufficient documentation

## 2020-11-23 HISTORY — DX: COVID-19: U07.1

## 2020-12-03 ENCOUNTER — Encounter: Payer: PRIVATE HEALTH INSURANCE | Admitting: Obstetrics and Gynecology

## 2020-12-09 ENCOUNTER — Emergency Department (HOSPITAL_COMMUNITY)
Admission: EM | Admit: 2020-12-09 | Discharge: 2020-12-09 | Disposition: A | Discharge status: Home / Self Care | Payer: BLUE CROSS/BLUE SHIELD | Attending: Emergency Medicine | Admitting: Emergency Medicine

## 2020-12-09 ENCOUNTER — Other Ambulatory Visit: Payer: Self-pay

## 2020-12-09 ENCOUNTER — Encounter (HOSPITAL_COMMUNITY): Payer: Self-pay | Admitting: Emergency Medicine

## 2020-12-09 ENCOUNTER — Emergency Department (HOSPITAL_COMMUNITY): Payer: BLUE CROSS/BLUE SHIELD

## 2020-12-09 DIAGNOSIS — J45909 Unspecified asthma, uncomplicated: Secondary | ICD-10-CM | POA: Diagnosis not present

## 2020-12-09 DIAGNOSIS — Z20822 Contact with and (suspected) exposure to covid-19: Secondary | ICD-10-CM

## 2020-12-09 DIAGNOSIS — E039 Hypothyroidism, unspecified: Secondary | ICD-10-CM | POA: Diagnosis not present

## 2020-12-09 DIAGNOSIS — U071 COVID-19: Secondary | ICD-10-CM | POA: Insufficient documentation

## 2020-12-09 DIAGNOSIS — R197 Diarrhea, unspecified: Secondary | ICD-10-CM

## 2020-12-09 DIAGNOSIS — Z7951 Long term (current) use of inhaled steroids: Secondary | ICD-10-CM | POA: Insufficient documentation

## 2020-12-09 DIAGNOSIS — R1030 Lower abdominal pain, unspecified: Secondary | ICD-10-CM | POA: Diagnosis present

## 2020-12-09 LAB — URINALYSIS, ROUTINE W REFLEX MICROSCOPIC
Bacteria, UA: NONE SEEN
Bilirubin Urine: NEGATIVE
Glucose, UA: NEGATIVE mg/dL
Ketones, ur: 20 mg/dL — AB
Leukocytes,Ua: NEGATIVE
Nitrite: NEGATIVE
Protein, ur: 100 mg/dL — AB
Specific Gravity, Urine: 1.015 (ref 1.005–1.030)
pH: 5 (ref 5.0–8.0)

## 2020-12-09 LAB — CBC
HCT: 41.7 % (ref 36.0–46.0)
Hemoglobin: 13.6 g/dL (ref 12.0–15.0)
MCH: 28.9 pg (ref 26.0–34.0)
MCHC: 32.6 g/dL (ref 30.0–36.0)
MCV: 88.7 fL (ref 80.0–100.0)
Platelets: 154 10*3/uL (ref 150–400)
RBC: 4.7 MIL/uL (ref 3.87–5.11)
RDW: 11.9 % (ref 11.5–15.5)
WBC: 5 10*3/uL (ref 4.0–10.5)
nRBC: 0 % (ref 0.0–0.2)

## 2020-12-09 LAB — COMPREHENSIVE METABOLIC PANEL
ALT: 27 U/L (ref 0–44)
AST: 31 U/L (ref 15–41)
Albumin: 4.1 g/dL (ref 3.5–5.0)
Alkaline Phosphatase: 54 U/L (ref 38–126)
Anion gap: 13 (ref 5–15)
BUN: 7 mg/dL (ref 6–20)
CO2: 20 mmol/L — ABNORMAL LOW (ref 22–32)
Calcium: 9.4 mg/dL (ref 8.9–10.3)
Chloride: 105 mmol/L (ref 98–111)
Creatinine, Ser: 0.69 mg/dL (ref 0.44–1.00)
GFR, Estimated: >60 mL/min (ref >60)
Glucose, Bld: 108 mg/dL — ABNORMAL HIGH (ref 70–99)
Potassium: 3.7 mmol/L (ref 3.5–5.1)
Sodium: 138 mmol/L (ref 135–145)
Total Bilirubin: 0.7 mg/dL (ref 0.3–1.2)
Total Protein: 7.9 g/dL (ref 6.5–8.1)

## 2020-12-09 LAB — I-STAT BETA HCG BLOOD, ED (MC, WL, AP ONLY): I-stat hCG, quantitative: <5 m[IU]/mL (ref <5)

## 2020-12-09 LAB — POC OCCULT BLOOD, ED: Fecal Occult Bld: NEGATIVE

## 2020-12-09 LAB — SARS CORONAVIRUS 2 (TAT 6-24 HRS): SARS Coronavirus 2: POSITIVE — AB

## 2020-12-09 MED ORDER — HALOPERIDOL LACTATE 5 MG/ML IJ SOLN
5.0000 mg | Freq: Once | INTRAMUSCULAR | Status: AC
  Administered 2020-12-09: 5 mg via INTRAVENOUS
  Filled 2020-12-09: qty 1

## 2020-12-09 MED ORDER — SODIUM CHLORIDE 0.9 % IV BOLUS
500.0000 mL | Freq: Once | INTRAVENOUS | Status: AC
  Administered 2020-12-09: 500 mL via INTRAVENOUS

## 2020-12-09 NOTE — ED Notes (Signed)
Patient requested to be discharged

## 2020-12-09 NOTE — ED Provider Notes (Signed)
[facility] DEPT Provider Note   CSN: [id number] Arrival date & time: 12/09/20  0001     History Chief Complaint  Patient presents with  . Abdominal Pain    Melanie Newton is a 60 y.o. female.   Abdominal Pain Pain location:  Suprapubic Pain quality: cramping   Pain radiates to:  Does not radiate Pain severity:  Moderate Onset quality:  Gradual Timing:  Constant Chronicity:  New Context: recent illness   Relieved by:  Nothing Worsened by:  Nothing Ineffective treatments:  None tried Associated symptoms: diarrhea and nausea   Associated symptoms: no anorexia, no constipation and no dysuria   Associated symptoms comment:  Diarrhea is dark but patient is taking peptobismol Risk factors: no alcohol abuse   Patient presents with headache a week ago and body pain and now dark diarrhea.  She also reports nausea and blood in the urin.       Past Medical History:  Diagnosis Date  . Allergic rhinitis   . Anxiety   . Asthma   . Depression   . Fibroids 2002  . High cholesterol   . Hyperlipidemia   . Hypothyroidism   . Vitamin D deficiency     There are no problems to display for this patient.   Past Surgical History:  Procedure Laterality Date  . ECTOPIC PREGNANCY SURGERY  2000   Laparotomy  . ROBOTIC ASSISTED TOTAL HYSTERECTOMY Right 10/25/2020   Procedure: XI ROBOTIC ASSISTED TOTAL HYSTERECTOMY and bilateral salpingectomy; Vaginal hysterectomy;  Surgeon: Rubie Maid, MD;  Location: [facility] ORS;  Service: Gynecology;  Laterality: Right;  . UTERINE FIBROID SURGERY     X2     OB History    Gravida  5   Para  1   Term  1   Preterm      AB  4   Living  1     SAB  3   IAB      Ectopic  1   Multiple      Live Births  1           Family History  Problem Relation Age of Onset  . Breast cancer Cousin 49  . Breast cancer Maternal Aunt 58  . Lupus Other   . Pancreatic cancer Other   . Hypertension Other   .  Diabetes Other   . Cancer Mother     Social History   Tobacco Use  . Smoking status: Never Smoker  . Smokeless tobacco: Never Used  Vaping Use  . Vaping Use: Never used  Substance Use Topics  . Alcohol use: Yes    Comment: occasional  . Drug use: Yes    Types: Marijuana    Comment: RECENTLY    Home Medications Prior to Admission medications   Medication Sig Start Date End Date Taking? Authorizing Provider  albuterol (PROVENTIL HFA;VENTOLIN HFA) 108 (90 BASE) MCG/ACT inhaler Inhale 2 puffs into the lungs every 6 (six) hours as needed for wheezing or shortness of breath. 05/13/15   Gregor Hams, MD  atorvastatin (LIPITOR) 40 MG tablet Take 40 mg by mouth at bedtime.     [provider]  Carboxymethylcellulose Sodium (THERATEARS OP) Place 1 drop into both eyes daily as needed (dry eyes).    [provider]  cholecalciferol (VITAMIN D3) 25 MCG (1000 UNIT) tablet Take 1,000 Units by mouth daily.    [provider]  citalopram (CELEXA) 20 MG tablet Take 30 mg by mouth at  bedtime.     [provider]  docusate sodium (COLACE) 100 MG capsule Take 1 capsule (100 mg total) by mouth 2 (two) times daily as needed for mild constipation. 10/25/20   Rubie Maid, MD  fluticasone (FLONASE) 50 MCG/ACT nasal spray Place 2 sprays into both nostrils daily as needed for allergies or rhinitis.    [provider]  ibuprofen (ADVIL) 600 MG tablet Take 1 tablet (600 mg total) by mouth every 6 (six) hours as needed. 10/25/20   Rubie Maid, MD  loratadine (CLARITIN) 10 MG tablet Take 10 mg by mouth daily as needed for allergies.     [provider]  montelukast (SINGULAIR) 10 MG tablet Take 10 mg by mouth at bedtime.     [provider]  oxyCODONE (ROXICODONE) 5 MG immediate release tablet Take 1 tablet (5 mg total) by mouth every 6 (six) hours as needed for severe pain. 10/29/20   Rubie Maid, MD  pseudoephedrine (SUDAFED) 30 MG tablet Take 30  mg by mouth every 4 (four) hours as needed for congestion.    [provider]  simethicone (GAS-X) 80 MG chewable tablet Chew 1 tablet (80 mg total) by mouth 4 (four) times daily as needed for flatulence. 10/25/20 10/25/21  Rubie Maid, MD    Allergies    Patient has no known allergies.  Review of Systems   Review of Systems  Gastrointestinal: Positive for abdominal pain, diarrhea and nausea. Negative for anorexia and constipation.  Genitourinary: Negative for dysuria.    Physical Exam Updated Vital Signs BP (!) 150/90   Pulse 71   Temp 99 F (37.2 C) (Oral)   Resp 10   Ht 5\' 5"  (1.651 m)   Wt 73.5 kg   LMP  (LMP Unknown)   SpO2 97%   BMI 26.96 kg/m   Physical Exam  ED Results / Procedures / Treatments   Labs (all labs ordered are listed, but only abnormal results are displayed) Labs Reviewed  COMPREHENSIVE METABOLIC PANEL - Abnormal; Notable for the following components:      Result Value   CO2 20 (*)    Glucose, Bld 108 (*)    All other components within normal limits  SARS CORONAVIRUS 2 (TAT 6-24 HRS)  CBC  URINALYSIS, ROUTINE W REFLEX MICROSCOPIC  OCCULT BLOOD X 1 CARD TO LAB, STOOL  I-STAT BETA HCG BLOOD, ED ([facility], [facility], [facility] ONLY)  POC OCCULT BLOOD, ED    EKG EKG Interpretation  Date/Time:  Monday December 09 2020 01:34:19 EST Ventricular Rate:  83 PR Interval:    QRS Duration: 92 QT Interval:  395 QTC Calculation: 465 R Axis:   70 Text Interpretation: Sinus rhythm Confirmed by Dory Horn) on 12/09/2020 2:12:21 AM   Radiology CT Renal Stone Study  Result Date: 12/09/2020 CLINICAL DATA:  Abdominal pain burning EXAM: CT ABDOMEN AND PELVIS WITHOUT CONTRAST TECHNIQUE: Multidetector CT imaging of the abdomen and pelvis was performed following the standard protocol without IV contrast. COMPARISON:  None. FINDINGS: Lower chest: The visualized heart size within normal limits. No pericardial fluid/thickening. No hiatal hernia. Ground-glass  opacities are seen at both lung bases. Hepatobiliary: Although limited due to the lack of intravenous contrast, normal in appearance without gross focal abnormality. No evidence of calcified gallstones or biliary ductal dilatation. Pancreas:  Unremarkable.  No surrounding inflammatory changes. Spleen: Normal in size. Although limited due to the lack of intravenous contrast, normal in appearance. Adrenals/Urinary Tract: Both adrenal glands appear normal. The kidneys and  collecting system appear normal without evidence of urinary tract calculus or hydronephrosis. There is question of mild superior bladder wall thickening present. Stomach/Bowel: The stomach, small bowel, and colon are normal in appearance. No inflammatory changes or obstructive findings. appendix is normal. Vascular/Lymphatic: There are no enlarged abdominal or pelvic lymph nodes. Scattered aortic atherosclerosis is noted. Reproductive: The patient is status post hysterectomy. No adnexal masses or collections seen. Other: No evidence of abdominal wall mass or hernia. Musculoskeletal: No acute or significant osseous findings. IMPRESSION: Mild ground-glass opacities at both lung bases which could be due to infectious process such is atypical viral pneumonia. Mild superior bladder wall thickening which could be due to mild cystitis. No renal or collecting system calculi. Aortic Atherosclerosis (ICD10-I70.0). Electronically Signed   By: Prudencio Pair M.D.   On: 12/09/2020 02:48    Procedures Procedures (including critical care time)  Medications Ordered in ED Medications  haloperidol lactate (HALDOL) injection 5 mg (5 mg Intravenous Given 12/09/20 0217)  sodium chloride 0.9 % bolus 500 mL (500 mLs Intravenous New Bag/Given 12/09/20 0216)    ED Course  I have reviewed the triage vital signs and the nursing notes.  Pertinent labs & imaging results that were available during my care of the patient were reviewed by me and considered in my medical  decision making (see chart for details).  I believe there are 2 things going on.  1. covid based on symptoms and lung windows on CT, covid swab sent patient placed in home quarantine.  2. Nausea is related to patient's marijuana use, BP is also consistent with CHS.  This is improved post medication.  Patient is well appearing and stable for   Melanie Newton was evaluated in Emergency Department on 12/09/2020 for the symptoms described in the history of present illness. She was evaluated in the context of the global COVID-19 pandemic, which necessitated consideration that the patient might be at risk for infection with the SARS-CoV-2 virus that causes COVID-19. Institutional protocols and algorithms that pertain to the evaluation of patients at risk for COVID-19 are in a state of rapid change based on information released by regulatory bodies including the CDC and federal and state organizations. These policies and algorithms were followed during the patient's care in the ED.  Final Clinical Impression(s) / ED Diagnoses  Return for intractable cough, coughing up blood,fevers >100.4 unrelieved by medication, shortness of breath, intractable vomiting, chest pain, shortness of breath, weakness,numbness, changes in speech, facial asymmetry,abdominal pain, passing out,Inability to tolerate liquids or food, cough, altered mental status or any concerns. No signs of systemic illness or infection. The patient is nontoxic-appearing on exam and vital signs are within normal limits.   I have reviewed the triage vital signs and the nursing notes. Pertinent labs &imaging results that were available during my care of the patient were reviewed by me and considered in my medical decision making (see chart for details).After history, exam, and medical workup I feel the patient has beenappropriately medically screened and is safe for discharge home. Pertinent diagnoses were discussed with the patient.  Patient was given return precautions.   Callaway Hardigree, MD 12/09/20 [id number]

## 2020-12-09 NOTE — Discharge Instructions (Signed)
Person Under Monitoring Name: Melanie Newton  Location: [place] [id number]   Infection Prevention Recommendations for Individuals Confirmed to have, or Being Evaluated for, 2019 Novel Coronavirus (COVID-19) Infection Who Receive Care at Home  Individuals who are confirmed to have, or are being evaluated for, COVID-19 should follow the prevention steps below until a healthcare provider or local or state health department says they can return to normal activities.  Stay home except to get medical care You should restrict activities outside your home, except for getting medical care. Do not go to work, school, or public areas, and do not use public transportation or taxis.  Call ahead before visiting your doctor Before your medical appointment, call the healthcare provider and tell them that you have, or are being evaluated for, COVID-19 infection. This will help the healthcare provider's office take steps to keep other people from getting infected. Ask your healthcare provider to call the local or state health department.  Monitor your symptoms Seek prompt medical attention if your illness is worsening (e.g., difficulty breathing). Before going to your medical appointment, call the healthcare provider and tell them that you have, or are being evaluated for, COVID-19 infection. Ask your healthcare provider to call the local or state health department.  Wear a facemask You should wear a facemask that covers your nose and mouth when you are in the same room with other people and when you visit a healthcare provider. People who live with or visit you should also wear a facemask while they are in the same room with you.  Separate yourself from other people in your home As much as possible, you should stay in a different room from other people in your home. Also, you should use a separate bathroom, if available.  Avoid sharing household items You  should not share dishes, drinking glasses, cups, eating utensils, towels, bedding, or other items with other people in your home. After using these items, you should wash them thoroughly with soap and water.  Cover your coughs and sneezes Cover your mouth and nose with a tissue when you cough or sneeze, or you can cough or sneeze into your sleeve. Throw used tissues in a lined trash can, and immediately wash your hands with soap and water for at least 20 seconds or use an alcohol-based hand rub.  Wash your [facility] your hands often and thoroughly with soap and water for at least 20 seconds. You can use an alcohol-based hand sanitizer if soap and water are not available and if your hands are not visibly dirty. Avoid touching your eyes, nose, and mouth with unwashed hands.   Prevention Steps for Caregivers and Household Members of Individuals Confirmed to have, or Being Evaluated for, COVID-19 Infection Being Cared for in the Home  If you live with, or provide care at home for, a person confirmed to have, or being evaluated for, COVID-19 infection please follow these guidelines to prevent infection:  Follow healthcare provider's instructions Make sure that you understand and can help the patient follow any healthcare provider instructions for all care.  Provide for the patient's basic needs You should help the patient with basic needs in the home and provide support for getting groceries, prescriptions, and other personal needs.  Monitor the patient's symptoms If they are getting sicker, call his or her medical provider and tell them that the patient has, or is being evaluated for, COVID-19 infection. This will help the healthcare provider's office take  steps to keep other people from getting infected. Ask the healthcare provider to call the local or state health department.  Limit the number of people who have contact with the patient If possible, have only one caregiver for the  patient. Other household members should stay in another home or place of residence. If this is not possible, they should stay in another room, or be separated from the patient as much as possible. Use a separate bathroom, if available. Restrict visitors who do not have an essential need to be in the home.  Keep older adults, very young children, and other sick people away from the patient Keep older adults, very young children, and those who have compromised immune systems or chronic health conditions away from the patient. This includes people with chronic heart, lung, or kidney conditions, diabetes, and cancer.  Ensure good ventilation Make sure that shared spaces in the home have good air flow, such as from an air conditioner or an opened window, weather permitting.  Wash your hands often Wash your hands often and thoroughly with soap and water for at least 20 seconds. You can use an alcohol based hand sanitizer if soap and water are not available and if your hands are not visibly dirty. Avoid touching your eyes, nose, and mouth with unwashed hands. Use disposable paper towels to dry your hands. If not available, use dedicated cloth towels and replace them when they become wet.  Wear a facemask and gloves Wear a disposable facemask at all times in the room and gloves when you touch or have contact with the patient's blood, body fluids, and/or secretions or excretions, such as sweat, saliva, sputum, nasal mucus, vomit, urine, or feces.  Ensure the mask fits over your nose and mouth tightly, and do not touch it during use. Throw out disposable facemasks and gloves after using them. Do not reuse. Wash your hands immediately after removing your facemask and gloves. If your personal clothing becomes contaminated, carefully remove clothing and launder. Wash your hands after handling contaminated clothing. Place all used disposable facemasks, gloves, and other waste in a lined container before  disposing them with other household waste. Remove gloves and wash your hands immediately after handling these items.  Do not share dishes, glasses, or other household items with the patient Avoid sharing household items. You should not share dishes, drinking glasses, cups, eating utensils, towels, bedding, or other items with a patient who is confirmed to have, or being evaluated for, COVID-19 infection. After the person uses these items, you should wash them thoroughly with soap and water.  Wash laundry thoroughly Immediately remove and wash clothes or bedding that have blood, body fluids, and/or secretions or excretions, such as sweat, saliva, sputum, nasal mucus, vomit, urine, or feces, on them. Wear gloves when handling laundry from the patient. Read and follow directions on labels of laundry or clothing items and detergent. In general, wash and dry with the warmest temperatures recommended on the label.  Clean all areas the individual has used often Clean all touchable surfaces, such as counters, tabletops, doorknobs, bathroom fixtures, toilets, phones, keyboards, tablets, and bedside tables, every day. Also, clean any surfaces that may have blood, body fluids, and/or secretions or excretions on them. Wear gloves when cleaning surfaces the patient has come in contact with. Use a diluted bleach solution (e.g., dilute bleach with 1 part bleach and 10 parts water) or a household disinfectant with a label that says EPA-registered for coronaviruses. To make a bleach solution  at home, add 1 tablespoon of bleach to 1 quart (4 cups) of water. For a larger supply, add  cup of bleach to 1 gallon (16 cups) of water. Read labels of cleaning products and follow recommendations provided on product labels. Labels contain instructions for safe and effective use of the cleaning product including precautions you should take when applying the product, such as wearing gloves or eye protection and making sure you  have good ventilation during use of the product. Remove gloves and wash hands immediately after cleaning.  Monitor yourself for signs and symptoms of illness Caregivers and household members are considered close contacts, should monitor their health, and will be asked to limit movement outside of the home to the extent possible. Follow the monitoring steps for close contacts listed on the symptom monitoring form.   ? If you have additional questions, contact your local health department or call the epidemiologist on call at [phone] (available 24/7). ? This guidance is subject to change. For the most up-to-date guidance from [facility], please refer to their website: [email or web address]

## 2020-12-09 NOTE — ED Triage Notes (Signed)
Pt reports R lower abdominal pain and burning that started around Thursday. States that she had a hysterectomy in December. Describes the pain as burning and sharp. She endorses vomiting, hematuria and black tarry stools with diarrhea. A&Ox4. Also states that Monday-Wednesday of last week, she had a severe headache.

## 2020-12-10 ENCOUNTER — Encounter: Payer: Self-pay | Admitting: Nurse Practitioner

## 2020-12-10 ENCOUNTER — Telehealth: Payer: Self-pay | Admitting: Nurse Practitioner

## 2020-12-10 DIAGNOSIS — E663 Overweight: Secondary | ICD-10-CM | POA: Insufficient documentation

## 2020-12-10 NOTE — Telephone Encounter (Signed)
Called patient to discuss Covid symptoms and the use of sotrovimab, a monoclonal antibody infusion for those with mild to moderate Covid symptoms and at a high risk of hospitalization.  Pt is qualified for this infusion at the [facility] due to; Specific high risk criteria : BMI > 25   Message left to call back our hotline [phone].  Murray Hodgkins, NP

## 2020-12-20 ENCOUNTER — Other Ambulatory Visit: Payer: Self-pay | Admitting: Nurse Practitioner

## 2020-12-20 DIAGNOSIS — Z1231 Encounter for screening mammogram for malignant neoplasm of breast: Secondary | ICD-10-CM

## 2021-02-04 ENCOUNTER — Ambulatory Visit
Admission: RE | Admit: 2021-02-04 | Discharge: 2021-02-04 | Disposition: A | Discharge status: Home / Self Care | Payer: BC Managed Care – PPO | Source: Ambulatory Visit | Attending: Nurse Practitioner | Admitting: Nurse Practitioner

## 2021-02-04 ENCOUNTER — Other Ambulatory Visit: Payer: Self-pay

## 2021-02-04 DIAGNOSIS — Z1231 Encounter for screening mammogram for malignant neoplasm of breast: Secondary | ICD-10-CM | POA: Diagnosis present

## 2021-12-22 ENCOUNTER — Other Ambulatory Visit: Payer: Self-pay | Admitting: Nurse Practitioner

## 2021-12-22 DIAGNOSIS — Z1231 Encounter for screening mammogram for malignant neoplasm of breast: Secondary | ICD-10-CM

## 2022-02-05 ENCOUNTER — Other Ambulatory Visit: Payer: Self-pay

## 2022-02-05 ENCOUNTER — Ambulatory Visit
Admission: RE | Admit: 2022-02-05 | Discharge: 2022-02-05 | Disposition: A | Discharge status: Home / Self Care | Payer: BC Managed Care – PPO | Source: Ambulatory Visit | Attending: Nurse Practitioner | Admitting: Nurse Practitioner

## 2022-02-05 DIAGNOSIS — Z1231 Encounter for screening mammogram for malignant neoplasm of breast: Secondary | ICD-10-CM | POA: Diagnosis present

## 2023-03-09 ENCOUNTER — Telehealth: Payer: Self-pay

## 2023-03-09 NOTE — Telephone Encounter (Signed)
Patient called stating that she has stopped taking her statin due to it causing her pains in her feet and legs. She is wanting to know if there is something else that needs to be called in

## 2023-03-10 ENCOUNTER — Other Ambulatory Visit: Payer: Self-pay | Admitting: Nurse Practitioner

## 2023-03-10 DIAGNOSIS — E782 Mixed hyperlipidemia: Secondary | ICD-10-CM

## 2023-03-10 MED ORDER — ROSUVASTATIN CALCIUM 20 MG PO TABS: 20.0000 mg | ORAL_TABLET | Freq: Every day | ORAL | 1 refills | Status: DC

## 2023-03-26 ENCOUNTER — Encounter: Payer: Self-pay | Admitting: Nurse Practitioner

## 2023-03-26 ENCOUNTER — Ambulatory Visit: Payer: PRIVATE HEALTH INSURANCE | Admitting: Nurse Practitioner

## 2023-03-26 VITALS — BP 128/74 | HR 63 | Ht 66.0 in | Wt 154.2 lb

## 2023-03-26 DIAGNOSIS — R7303 Prediabetes: Secondary | ICD-10-CM | POA: Insufficient documentation

## 2023-03-26 DIAGNOSIS — E782 Mixed hyperlipidemia: Secondary | ICD-10-CM

## 2023-03-26 DIAGNOSIS — M7918 Myalgia, other site: Secondary | ICD-10-CM | POA: Diagnosis not present

## 2023-03-26 HISTORY — DX: Prediabetes: R73.03

## 2023-03-26 NOTE — Progress Notes (Signed)
Established Patient Office Visit  Subjective:  Patient ID: Melanie Newton, female    DOB: 09/20/1961  Age: 62 y.o. MRN: [id number]  Chief Complaint  Patient presents with   Acute Visit    Pain in bilateral feet and legs, started last week.    Acute visit for pain.  Changed statin because patient felt it was causing neuropathy to bilateral feet and lower legs.  Pain felt like pins and needles.      No other concerns at this time.   Past Medical History:  Diagnosis Date   Allergic rhinitis    Anxiety    Asthma    COVID-19 virus infection 11/2020   Depression    Fibroids 2002   High cholesterol    Hyperlipidemia    Hypothyroidism    Overweight (BMI 25.0-29.9)    Vitamin D deficiency     Past Surgical History:  Procedure Laterality Date   ECTOPIC PREGNANCY SURGERY  2000   Laparotomy   ROBOTIC ASSISTED TOTAL HYSTERECTOMY Right 10/25/2020   Procedure: XI ROBOTIC ASSISTED TOTAL HYSTERECTOMY and bilateral salpingectomy; Vaginal hysterectomy;  Surgeon: Hildred Laser, MD;  Location: [facility] OR[facility];  Service: Gynecology;  Laterality: Right;   UTERINE FIBROID SURGERY     X2    Social History   Socioeconomic History   Marital status: Married    Spouse name: Not on file   Number of children: Not on file   Years of education: Not on file   Highest education level: Not on file  Occupational History   Not on file  Tobacco Use   Smoking status: Never   Smokeless tobacco: Never  Vaping Use   Vaping Use: Never used  Substance and Sexual Activity   Alcohol use: Yes    Comment: occasional   Drug use: Yes    Types: Marijuana    Comment: RECENTLY   Sexual activity: Not Currently  Other Topics Concern   Not on file  Social History Narrative   Not on file   Social Determinants of Health   Financial Resource Strain: Not on file  Food Insecurity: Not on file  Transportation Needs: Not on file  Physical Activity: Not on file  Stress: Not on file  Social Connections:  Not on file  Intimate Partner Violence: Not on file    Family History  Problem Relation Age of Onset   Breast cancer Cousin 39       maternal   Breast cancer Maternal Aunt 58   Lupus Other    Pancreatic cancer Other    Hypertension Other    Diabetes Other    Cancer Mother     No Known Allergies  Review of Systems  Constitutional: Negative.   HENT: Negative.    Eyes: Negative.   Respiratory: Negative.    Cardiovascular: Negative.   Gastrointestinal: Negative.   Genitourinary: Negative.   Musculoskeletal:  Positive for myalgias.  Skin: Negative.   Neurological: Negative.   Endo/Heme/Allergies: Negative.   Psychiatric/Behavioral:  Positive for depression. The patient is nervous/anxious.        Objective:   BP 128/74   Pulse 63   Ht 5\' 6"  (1.676 m)   Wt 154 lb 3.2 oz (69.9 kg)   LMP  (LMP Unknown)   SpO2 97%   BMI 24.89 kg/m   Vitals:   03/26/23 1320  BP: 128/74  Pulse: 63  Height: 5\' 6"  (1.676 m)  Weight: 154 lb 3.2 oz (69.9 kg)  SpO2: 97%  BMI (Calculated):  24.9    Physical Exam Vitals reviewed.  Constitutional:      Appearance: Normal appearance.  HENT:     Head: Normocephalic.     Nose: Nose normal.     Mouth/Throat:     Mouth: Mucous membranes are moist.  Eyes:     Pupils: Pupils are equal, round, and reactive to light.  Cardiovascular:     Rate and Rhythm: Normal rate and regular rhythm.  Pulmonary:     Effort: Pulmonary effort is normal.     Breath sounds: Normal breath sounds.  Abdominal:     General: Bowel sounds are normal.     Palpations: Abdomen is soft.  Musculoskeletal:        General: Tenderness present.     Cervical back: Normal range of motion and neck supple.  Skin:    General: Skin is warm and dry.  Neurological:     Mental Status: She is alert and oriented to person, place, and time.  Psychiatric:        Mood and Affect: Mood normal.        Behavior: Behavior normal.      No results found for any visits on  03/26/23.  No results found for this or any previous visit (from the past 2160 hour(s)).    Assessment & Plan:   Problem List Items Addressed This Visit   None   No follow-ups on file.   Total time spent: 35 minutes  Orson Eva, NP  03/26/2023

## 2023-03-26 NOTE — Patient Instructions (Signed)
1) stop statins 2) low chol diet 3) red yeast rice 4) follow up appt in 3 months, fasting labs prior

## 2023-03-30 ENCOUNTER — Telehealth: Payer: Self-pay | Admitting: Nurse Practitioner

## 2023-03-30 MED ORDER — GABAPENTIN 100 MG PO CAPS: 100.0000 mg | ORAL_CAPSULE | Freq: Every day | ORAL | 0 refills | Status: DC

## 2023-03-30 NOTE — Telephone Encounter (Signed)
Patient left VM that she was here on Friday for nerve pain and was offered a prescription for it but declined it. She is now wanting the Rx because her feet are hurting so bad that she doesn't want to walk on them.

## 2023-03-30 NOTE — Addendum Note (Signed)
Addended by: Grayling Congress on: 03/30/2023 09:11 AM   Modules accepted: Orders

## 2023-04-15 ENCOUNTER — Other Ambulatory Visit: Payer: Self-pay | Admitting: Nurse Practitioner

## 2023-04-15 DIAGNOSIS — Z1231 Encounter for screening mammogram for malignant neoplasm of breast: Secondary | ICD-10-CM

## 2023-04-29 ENCOUNTER — Ambulatory Visit
Admission: RE | Admit: 2023-04-29 | Discharge: 2023-04-29 | Disposition: A | Discharge status: Home / Self Care | Payer: BC Managed Care – PPO | Source: Ambulatory Visit | Attending: Nurse Practitioner | Admitting: Nurse Practitioner

## 2023-04-29 DIAGNOSIS — Z1231 Encounter for screening mammogram for malignant neoplasm of breast: Secondary | ICD-10-CM | POA: Insufficient documentation

## 2023-05-18 ENCOUNTER — Encounter: Payer: Self-pay | Admitting: Internal Medicine

## 2023-05-18 ENCOUNTER — Encounter: Payer: Self-pay | Admitting: Nurse Practitioner

## 2023-05-18 ENCOUNTER — Ambulatory Visit: Payer: PRIVATE HEALTH INSURANCE | Admitting: Internal Medicine

## 2023-05-18 VITALS — BP 120/70 | HR 95 | Ht 67.0 in | Wt 152.6 lb

## 2023-05-18 DIAGNOSIS — E782 Mixed hyperlipidemia: Secondary | ICD-10-CM | POA: Diagnosis not present

## 2023-05-18 DIAGNOSIS — R202 Paresthesia of skin: Secondary | ICD-10-CM | POA: Diagnosis not present

## 2023-05-18 DIAGNOSIS — M7918 Myalgia, other site: Secondary | ICD-10-CM

## 2023-05-18 DIAGNOSIS — G4459 Other complicated headache syndrome: Secondary | ICD-10-CM

## 2023-05-18 MED ORDER — GABAPENTIN 300 MG PO CAPS: 300.0000 mg | ORAL_CAPSULE | Freq: Two times a day (BID) | ORAL | 2 refills | Status: DC

## 2023-05-18 NOTE — Progress Notes (Signed)
Established Patient Office Visit  Subjective:  Patient ID: Melanie Newton, female    DOB: 02/01/1961  Age: 62 y.o. MRN: [id number]  No chief complaint on file.   Patient in office complaining of burning pain and tingling in left hand, both feet and her head. Tingling started the end of April 2024. Was started on gabapentin 100 mg daily on May 7th with no relief. Patient returns today, continues to have tingling. Will increase gabapentin to 300 mg twice daily.  Does not have any back or neck pain. Check blood work today.  Patient also complaining of headaches mostly on right side. Does not have history of migraines, but did have Tension headaches in the past. Rates pain 10+/10. Affecting her activities of daily living. States tylenol does not help. Ibuprofen decreases severity of headache but does not completely resolve it.  Will get a CT scan of the head. If pain worsens, patient to go to the ED.  Blood work today. Will try Nurtec 75 mg ODT. Samples given.    No other concerns at this time.   Past Medical History:  Diagnosis Date   Allergic rhinitis    Anxiety    Asthma    COVID-19 virus infection 11/2020   Depression    Fibroids 2002   High cholesterol    Hyperlipidemia    Hypothyroidism    Overweight (BMI 25.0-29.9)    Vitamin D deficiency     Past Surgical History:  Procedure Laterality Date   ECTOPIC PREGNANCY SURGERY  2000   Laparotomy   ROBOTIC ASSISTED TOTAL HYSTERECTOMY Right 10/25/2020   Procedure: XI ROBOTIC ASSISTED TOTAL HYSTERECTOMY and bilateral salpingectomy; Vaginal hysterectomy;  Surgeon: Hildred Laser, MD;  Location: [facility] OR[facility];  Service: Gynecology;  Laterality: Right;   UTERINE FIBROID SURGERY     X2    Social History   Socioeconomic History   Marital status: Married    Spouse name: Not on file   Number of children: Not on file   Years of education: Not on file   Highest education level: Not on file  Occupational History   Not on file   Tobacco Use   Smoking status: Never   Smokeless tobacco: Never  Vaping Use   Vaping Use: Never used  Substance and Sexual Activity   Alcohol use: Yes    Comment: occasional   Drug use: Yes    Types: Marijuana    Comment: RECENTLY   Sexual activity: Not Currently  Other Topics Concern   Not on file  Social History Narrative   Not on file   Social Determinants of Health   Financial Resource Strain: Not on file  Food Insecurity: Not on file  Transportation Needs: Not on file  Physical Activity: Not on file  Stress: Not on file  Social Connections: Not on file  Intimate Partner Violence: Not on file    Family History  Problem Relation Age of Onset   Breast cancer Cousin 30       maternal   Breast cancer Maternal Aunt 58   Lupus Other    Pancreatic cancer Other    Hypertension Other    Diabetes Other    Cancer Mother     No Known Allergies  Review of Systems  Constitutional: Negative.   HENT: Negative.  Negative for hearing loss and tinnitus.   Eyes:  Positive for blurred vision and photophobia. Negative for pain, discharge and redness.  Respiratory: Negative.  Negative for cough and shortness of breath.  Cardiovascular: Negative.  Negative for chest pain, palpitations and leg swelling.  Gastrointestinal:  Positive for nausea. Negative for abdominal pain, constipation, diarrhea, heartburn and vomiting.  Genitourinary: Negative.  Negative for dysuria and flank pain.  Musculoskeletal: Negative.  Negative for back pain, falls, joint pain, myalgias and neck pain.  Skin: Negative.   Neurological:  Positive for dizziness, tingling, sensory change and headaches. Negative for tremors, speech change, focal weakness, seizures and loss of consciousness.  Endo/Heme/Allergies: Negative.   Psychiatric/Behavioral: Negative.  Negative for depression and suicidal ideas. The patient is not nervous/anxious.        Objective:   BP 120/70   Pulse 95   Ht 5\' 7"  (1.702 m)   Wt  152 lb 9.6 oz (69.2 kg)   LMP  (LMP Unknown)   SpO2 97%   BMI 23.90 kg/m   Vitals:   05/18/23 1517  BP: 120/70  Pulse: 95  Height: 5\' 7"  (1.702 m)  Weight: 152 lb 9.6 oz (69.2 kg)  SpO2: 97%  BMI (Calculated): 23.89    Physical Exam Vitals and nursing note reviewed.  Constitutional:      General: She is not in acute distress.    Appearance: Normal appearance.  HENT:     Head: Normocephalic and atraumatic.     Nose: Nose normal.  Cardiovascular:     Rate and Rhythm: Normal rate and regular rhythm.     Pulses: Normal pulses.     Heart sounds: Normal heart sounds. No murmur heard. Pulmonary:     Effort: Pulmonary effort is normal.     Breath sounds: Normal breath sounds. No wheezing.  Abdominal:     General: Bowel sounds are normal.     Palpations: Abdomen is soft.     Tenderness: There is no abdominal tenderness. There is no right CVA tenderness or left CVA tenderness.  Musculoskeletal:        General: Normal range of motion.     Cervical back: Normal range of motion.     Right lower leg: No edema.     Left lower leg: No edema.  Skin:    General: Skin is warm and dry.  Neurological:     General: No focal deficit present.     Mental Status: She is alert and oriented to person, place, and time.     Gait: Gait normal.     Deep Tendon Reflexes: Reflexes normal.  Psychiatric:        Mood and Affect: Mood normal.        Behavior: Behavior normal.      No results found for any visits on 05/18/23.  No results found for this or any previous visit (from the past 2160 hour(s)).    Assessment & Plan:  Will check blood work today. Increase gabapentin. CT scan of the head. Trial Nurtec 75 mg ODT. Go to ED if headache worsens. Return next week.   Problem List Items Addressed This Visit     Mixed hyperlipidemia   Myalgia, multiple sites   Paresthesia of both feet - Primary   Relevant Orders   CK, total   Sed Rate (ESR)   Vitamin B12   Vitamin D (25 hydroxy)    Other complicated headache syndrome   Relevant Medications   gabapentin (NEURONTIN) 300 MG capsule   Other Relevant Orders   CT HEAD W & WO CONTRAST ( )    Return in about 1 week (around 05/25/2023) for after ct scan.   Total time  spent: 30 minutes  Melanie Loveless, MD  05/18/2023   This document may have been prepared by [facility] Voice Recognition software and as such may include unintentional dictation errors.

## 2023-05-21 ENCOUNTER — Encounter (HOSPITAL_COMMUNITY): Payer: Self-pay

## 2023-05-21 ENCOUNTER — Emergency Department (HOSPITAL_COMMUNITY)
Admission: EM | Admit: 2023-05-21 | Discharge: 2023-05-21 | Disposition: A | Discharge status: Home / Self Care | Payer: BC Managed Care – PPO | Attending: Emergency Medicine | Admitting: Emergency Medicine

## 2023-05-21 ENCOUNTER — Other Ambulatory Visit: Payer: Self-pay

## 2023-05-21 ENCOUNTER — Emergency Department (HOSPITAL_COMMUNITY): Payer: BC Managed Care – PPO

## 2023-05-21 DIAGNOSIS — I720 Aneurysm of carotid artery: Secondary | ICD-10-CM | POA: Insufficient documentation

## 2023-05-21 DIAGNOSIS — R519 Headache, unspecified: Secondary | ICD-10-CM | POA: Insufficient documentation

## 2023-05-21 LAB — CBC WITH DIFFERENTIAL/PLATELET
Abs Immature Granulocytes: 0.01 10*3/uL (ref 0.00–0.07)
Basophils Absolute: 0 10*3/uL (ref 0.0–0.1)
Basophils Relative: 1 %
Eosinophils Absolute: 0.2 10*3/uL (ref 0.0–0.5)
Eosinophils Relative: 3 %
HCT: 40.7 % (ref 36.0–46.0)
Hemoglobin: 13.3 g/dL (ref 12.0–15.0)
Immature Granulocytes: 0 %
Lymphocytes Relative: 37 %
Lymphs Abs: 1.7 10*3/uL (ref 0.7–4.0)
MCH: 29.4 pg (ref 26.0–34.0)
MCHC: 32.7 g/dL (ref 30.0–36.0)
MCV: 90 fL (ref 80.0–100.0)
Monocytes Absolute: 0.4 10*3/uL (ref 0.1–1.0)
Monocytes Relative: 9 %
Neutro Abs: 2.3 10*3/uL (ref 1.7–7.7)
Neutrophils Relative %: 50 %
Platelets: 243 10*3/uL (ref 150–400)
RBC: 4.52 MIL/uL (ref 3.87–5.11)
RDW: 11.8 % (ref 11.5–15.5)
WBC: 4.7 10*3/uL (ref 4.0–10.5)
nRBC: 0 % (ref 0.0–0.2)

## 2023-05-21 LAB — BASIC METABOLIC PANEL
Anion gap: 13 (ref 5–15)
BUN: 9 mg/dL (ref 6–20)
CO2: 25 mmol/L (ref 22–32)
Calcium: 9.8 mg/dL (ref 8.9–10.3)
Chloride: 102 mmol/L (ref 98–111)
Creatinine, Ser: 0.74 mg/dL (ref 0.44–1.00)
GFR, Estimated: >60 mL/min (ref >60)
Glucose, Bld: 99 mg/dL (ref 70–99)
Potassium: 3.6 mmol/L (ref 3.5–5.1)
Sodium: 140 mmol/L (ref 135–145)

## 2023-05-21 LAB — SEDIMENTATION RATE: Sed Rate: 6 mm/hr (ref 0–22)

## 2023-05-21 MED ORDER — IOHEXOL 350 MG/ML SOLN
75.0000 mL | Freq: Once | INTRAVENOUS | Status: AC | PRN
  Administered 2023-05-21: 75 mL via INTRAVENOUS

## 2023-05-21 MED ORDER — PROCHLORPERAZINE EDISYLATE 10 MG/2ML IJ SOLN
10.0000 mg | Freq: Once | INTRAMUSCULAR | Status: AC
  Administered 2023-05-21: 10 mg via INTRAVENOUS
  Filled 2023-05-21: qty 2

## 2023-05-21 MED ORDER — DIPHENHYDRAMINE HCL 50 MG/ML IJ SOLN
25.0000 mg | Freq: Once | INTRAMUSCULAR | Status: AC
  Administered 2023-05-21: 25 mg via INTRAVENOUS
  Filled 2023-05-21: qty 1

## 2023-05-21 NOTE — ED Provider Notes (Signed)
[place] EMERGENCY DEPARTMENT AT [facility] Provider Note   CSN: [id number] Arrival date & time: 05/21/23  4098     History  Chief Complaint  Patient presents with   Headache    Melanie Newton is a 62 y.o. female.  Patient with history of neuropathy on gabapentin, hypercholesterolemia currently no treatment -- presents to the emergency department for approximately 3 weeks of worsening headache.  Headaches have been intermittent but severe.  She has had approximately 4 headaches.  Her most recent headache began yesterday, described as a pressure across her entire head, described as throbbing.  She had difficulty sleeping due to the pain.  She states that initially her symptoms seem to be triggered and worsened by exercise such as swimming in water.  Now her symptoms have been more constant.  Pain radiates to behind her eyes and to the back of the neck at times.  It seems to be most focused around her left temporal area. Patient denies signs of stroke including: facial droop, slurred speech, aphasia, weakness in extremities, imbalance/trouble walking.  She denies sinusitis symptoms or tooth ache symptoms.  Symptoms do seem to worsen by position changes.  No fevers.  She has associated light sensitivity.  Recently saw PCP and she reported extremity tingling.  Labs were ordered (not completed) and CT was ordered with plan for recheck in 1 week.         Home Medications Prior to Admission medications   Medication Sig Start Date End Date Taking? Authorizing Provider  atorvastatin (LIPITOR) 40 MG tablet Take 40 mg by mouth daily. Taking 1/2 tab daily.    [provider]  citalopram (CELEXA) 40 MG tablet Take 40 mg by mouth at bedtime. 02/28/23   [provider]  gabapentin (NEURONTIN) 300 MG capsule Take 1 capsule (300 mg total) by mouth 2 (two) times daily. 05/18/23 05/17/24  Margaretann Loveless, MD      Allergies    Patient has no known allergies.    Review of  Systems   Review of Systems  Physical Exam Updated Vital Signs BP (!) 179/97   Pulse 68   Temp 98.7 F (37.1 C) (Oral)   Resp 18   Ht 5\' 7"  (1.702 m)   Wt 69.2 kg   LMP  (LMP Unknown)   SpO2 99%   BMI 23.89 kg/m   Physical Exam Vitals and nursing note reviewed.  Constitutional:      General: She is not in acute distress.    Appearance: She is well-developed.  HENT:     Head: Normocephalic and atraumatic.     Jaw: No trismus.     Comments: Patient winces in pain when I press over the left temporal area.    Right Ear: Tympanic membrane, ear canal and external ear normal.     Left Ear: Tympanic membrane, ear canal and external ear normal.     Nose: Nose normal. No mucosal edema or rhinorrhea.     Mouth/Throat:     Mouth: Mucous membranes are moist. Mucous membranes are not dry. No oral lesions.     Pharynx: Uvula midline. No oropharyngeal exudate, posterior oropharyngeal erythema or uvula swelling.     Tonsils: No tonsillar abscesses.  Eyes:     General:        Right eye: No discharge.        Left eye: No discharge.     Conjunctiva/sclera: Conjunctivae normal.  Cardiovascular:     Rate and Rhythm:  Normal rate and regular rhythm.     Heart sounds: Normal heart sounds. No murmur heard. Pulmonary:     Effort: Pulmonary effort is normal. No respiratory distress.     Breath sounds: Normal breath sounds. No wheezing, rhonchi or rales.  Abdominal:     Palpations: Abdomen is soft.     Tenderness: There is no abdominal tenderness. There is no guarding or rebound.  Musculoskeletal:     Cervical back: Normal range of motion and neck supple.     Right lower leg: No edema.     Left lower leg: No edema.  Lymphadenopathy:     Cervical: No cervical adenopathy.  Skin:    General: Skin is warm and dry.     Findings: No rash.  Neurological:     General: No focal deficit present.     Mental Status: She is alert. Mental status is at baseline.     Motor: No weakness.  Psychiatric:         Mood and Affect: Mood normal.     ED Results / Procedures / Treatments   Labs (all labs ordered are listed, but only abnormal results are displayed) Labs Reviewed  CBC WITH DIFFERENTIAL/PLATELET  BASIC METABOLIC PANEL  SEDIMENTATION RATE    EKG None  Radiology No results found.  Procedures Procedures    Medications Ordered in ED Medications  prochlorperazine (COMPAZINE) injection 10 mg (has no administration in time range)  diphenhydrAMINE (BENADRYL) injection 25 mg (has no administration in time range)    ED Course/ Medical Decision Making/ A&P    Patient seen and examined. History obtained directly from patient.  Patient with new type of headache for her, intermittent, with some red flags including age.  No focal neuro deficits on exam however symptoms have been worse at night, positional.  She also has left temporal pain without vision changes.  Labs/EKG: Ordered CBC, BMP, ESR  Imaging: Ordered CT angio head and neck.  Medications/Fluids: Ordered: Compazine and Benadryl.   Most recent vital signs reviewed and are as follows: BP (!) 179/97   Pulse 68   Temp 98.7 F (37.1 C) (Oral)   Resp 18   Ht 5\' 7"  (1.702 m)   Wt 69.2 kg   LMP  (LMP Unknown)   SpO2 99%   BMI 23.89 kg/m   Initial impression: Acute recurrent headache with change in pattern and character.  Broad differential considered.  Will evaluate potential for aneurysm, dissection, mass, GCA.  Patient does not have focal symptoms suggestive of stroke.  Low concern for intracranial hemorrhage based on history and exam.  If workup is negative, she would likely benefit from outpatient neurology follow-up/PCP follow-up.  10:28 AM Reassessment performed. Patient appears comfortable.  I have discussed CTA findings with Dr. Rubin Payor attending ED physician as well as Dr. Yetta Barre on-call neurosurgery.  Neurosurgery feels that it is unlikely this patient's aneurysm is causing her headache symptoms.  She  would benefit from outpatient neurosurgery follow-up with Dr. Conchita Paris.  No emergent treatment or further evaluation recommended at this time.  Labs personally reviewed and interpreted including: CBC, BMP and sed rate were normal  Imaging personally visualized and interpreted including: CTA of the head and neck, atherosclerosis noted, ICA aneurysm noted in an area that would not likely cause a subarachnoid hemorrhage.  Reviewed pertinent lab work and imaging with patient at bedside. Questions answered.   Most current vital signs reviewed and are as follows: BP (!) 168/94 (BP Location: Right Arm)  Pulse 68   Temp 98.7 F (37.1 C) (Oral)   Resp 18   Ht 5\' 7"  (1.702 m)   Wt 69.2 kg   LMP  (LMP Unknown)   SpO2 99%   BMI 23.89 kg/m   Plan: Discharge to home.  Patient is feeling a bit better and comfortable with this plan.  Family member at bedside also involved with discussion of results.  Prescriptions written for: None  Other home care instructions discussed: Avoidance of triggers, rest, OTC medications  ED return instructions discussed: Patient counseled to return if they have weakness in their arms or legs, slurred speech, trouble walking or talking, confusion, trouble with their balance, or if they have any other concerns. Patient verbalizes understanding and agrees with plan.   Follow-up instructions discussed: Patient encouraged to follow-up with neurology and neurosurgery in 1 week.  I have placed ambulatory referral to neurology on the patient's behalf.  Encourage PCP follow-up as planned.                            Medical Decision Making Amount and/or Complexity of Data Reviewed Labs: ordered. Radiology: ordered.  Risk Prescription drug management.   In regards to the patient's headache, critical differentials were considered including subarachnoid hemorrhage, intracerebral hemorrhage, epidural/subdural hematoma, pituitary apoplexy, vertebral/carotid artery  dissection, giant cell arteritis, central venous thrombosis, reversible cerebral vasoconstriction, acute angle closure glaucoma, idiopathic intracranial hypertension, bacterial meningitis, viral encephalitis, carbon monoxide poisoning, posterior reversible encephalopathy syndrome, pre-eclampsia.   Reg flag symptoms related to these causes were considered including systemic symptoms (fever, weight loss), neurologic symptoms (confusion, mental status change, vision change, associated seizure), acute or sudden "thunderclap" onset, patient age 8 or older with new or progressive headache, patient of any age with first headache or change in headache pattern, pregnant or postpartum status, history of HIV or other immunocompromise, history of cancer, headache occurring with exertion, associated neck or shoulder pain, associated traumatic injury, concurrent use of anticoagulation, family history of spontaneous SAH, and concurrent drug use.    Other benign, more common causes of headache were considered including migraine, tension-type headache, cluster headache, referred pain from other cause such as sinus infection, dental pain, trigeminal neuralgia.   On exam, patient has a reassuring neuro exam including baseline mental status, no significant neck pain or meningeal signs, no signs of severe infection or fever.   Patient does have some incidental findings that we will need to be followed up as outpatient.  I did obtain advice regarding CTA findings by neurosurgery by telephone today.  Agree, no indication for admission or emergent intervention.  The patient's vital signs, pertinent lab work and imaging were reviewed and interpreted as discussed in the ED course. Hospitalization was considered for further testing, treatments, or serial exams/observation. However as patient is well-appearing, has a stable exam over the course of their evaluation, and reassuring studies today, I do not feel that they warrant  admission at this time. This plan was discussed with the patient who verbalizes agreement and comfort with this plan and seems reliable and able to return to the Emergency Department with worsening or changing symptoms.          Final Clinical Impression(s) / ED Diagnoses Final diagnoses:  Acute nonintractable headache, unspecified headache type  Aneurysm of carotid artery (HCC)    Rx / DC Orders ED Discharge Orders          Ordered    Ambulatory referral  to Neurology       Comments: An appointment is requested in approximately: 1 week for several weeks of new-type HA   05/21/23 1024              Renne Crigler, PA-C 05/21/23 1031    Benjiman Core, MD 05/21/23 1609

## 2023-05-21 NOTE — ED Triage Notes (Signed)
Per pt, she has had a severe headache for a couple of weeks. Has a hx of tension headaches. Pt reports the pain is mostly on her L temple and behind her eyes. When she swims, the pain will go from her L temple to her whole head and she has to stop what she is doing. Rates her pain 7/10. Describes it as throbbing. Pt is also reporting severe pain in both of her legs. Her doctor prescribed gabapentin, but it isn't helping with the pain.

## 2023-05-21 NOTE — Discharge Instructions (Addendum)
Please read and follow all provided instructions.  Your diagnoses today include:  1. Acute nonintractable headache, unspecified headache type     Tests performed today include: Blood cell count and electrolytes were all normal An inflammatory marker test called sed rate, was normal CT scan of your head and neck with contrast showed some cholesterol buildup as well as a small left internal carotid artery aneurysm which will need follow-up with Dr. Conchita Paris, please call office for an appointment. Vital signs. See below for your results today.   Medications:  In the Emergency Department you received: Compazine - antinausea/headache medication Benadryl - antihistamine to counteract potential side effects of reglan  Take any prescribed medications only as directed.  Additional information:  Follow any educational materials contained in this packet.  You are having a headache. No specific cause was found today for your headache. It may have been a migraine or other cause of headache. Stress, anxiety, fatigue, and depression are common triggers for headaches.   Your headache today does not appear to be life-threatening or require hospitalization, but often the exact cause of headaches is not determined in the emergency department. Therefore, follow-up with your doctor is very important to find out what may have caused your headache and whether or not you need any further diagnostic testing or treatment.   Sometimes headaches can appear benign (not harmful), but then more serious symptoms can develop which should prompt an immediate re-evaluation by your doctor or the emergency department.  BE VERY CAREFUL not to take multiple medicines containing Tylenol (also called acetaminophen). Doing so can lead to an overdose which can damage your liver and cause liver failure and possibly death.   Follow-up instructions: Please follow-up with your primary care provider as planned.  In addition, you  should see the neurologist and neurosurgeon in about 1 week for evaluation of your headaches and the small aneurysm found on imaging today.  Return instructions:  Please return to the Emergency Department if you experience worsening symptoms. Return if the medications do not resolve your headache, if it recurs, or if you have multiple episodes of vomiting or cannot keep down fluids. Return if you have a change from the usual headache. RETURN IMMEDIATELY IF you: Develop a sudden, severe headache Develop confusion or become poorly responsive or faint Develop a fever above 100.73F or problem breathing Have a change in speech, vision, swallowing, or understanding Develop new weakness, numbness, tingling, incoordination in your arms or legs Have a seizure Please return if you have any other emergent concerns.  Additional Information:  Your vital signs today were: BP (!) 168/94 (BP Location: Right Arm)   Pulse 68   Temp 98.7 F (37.1 C) (Oral)   Resp 18   Ht 5\' 7"  (1.702 m)   Wt 69.2 kg   LMP  (LMP Unknown)   SpO2 99%   BMI 23.89 kg/m  If your blood pressure (BP) was elevated above 135/85 this visit, please have this repeated by your doctor within one month. --------------

## 2023-05-25 ENCOUNTER — Encounter: Payer: Self-pay | Admitting: Internal Medicine

## 2023-05-25 ENCOUNTER — Encounter: Payer: PRIVATE HEALTH INSURANCE | Admitting: Nurse Practitioner

## 2023-05-25 ENCOUNTER — Ambulatory Visit: Payer: PRIVATE HEALTH INSURANCE | Admitting: Internal Medicine

## 2023-05-25 VITALS — BP 120/78 | HR 60 | Ht 67.0 in | Wt 150.8 lb

## 2023-05-25 DIAGNOSIS — R202 Paresthesia of skin: Secondary | ICD-10-CM | POA: Diagnosis not present

## 2023-05-25 DIAGNOSIS — G4459 Other complicated headache syndrome: Secondary | ICD-10-CM | POA: Diagnosis not present

## 2023-05-25 NOTE — Progress Notes (Signed)
Established Patient Office Visit  Subjective:  Patient ID: Melanie Newton, female    DOB: 1961-11-06  Age: 62 y.o. MRN: [id number]  Chief Complaint  Patient presents with   Follow-up    1 week follow up    Patient comes in for her follow-up today.  She had to go to the emergency room for a severe headache mostly on the left side.  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  Patient has been referred to a neurosurgeon.  However he is not in her [facility] network.  Most probably she will have to go back to [place] for that. However she is feeling better today.  She does not have any more headaches.  And gabapentin increased dose is really helping her paresthesias. Her labs done in the emergency room were also within normal limits.    No other concerns at this time.   Past Medical History:  Diagnosis Date   Allergic rhinitis    Anxiety    Asthma    COVID-19 virus infection 11/2020   Depression    Fibroids 2002   High cholesterol    Hyperlipidemia    Hypothyroidism    Overweight (BMI 25.0-29.9)    Vitamin D deficiency     Past Surgical History:  Procedure Laterality Date   ECTOPIC PREGNANCY SURGERY  2000   Laparotomy   ROBOTIC ASSISTED TOTAL HYSTERECTOMY Right 10/25/2020   Procedure: XI ROBOTIC ASSISTED TOTAL HYSTERECTOMY and bilateral salpingectomy; Vaginal hysterectomy;  Surgeon: Hildred Laser, MD;  Location: [facility] ORS;  Service: Gynecology;  Laterality: Right;   UTERINE FIBROID SURGERY     X2    Social History   Socioeconomic History   Marital status: Married    Spouse name: Not on file   Number of children: Not on file   Years of education: Not on file   Highest education level: Not on file  Occupational History   Not on file  Tobacco Use   Smoking status: Never   Smokeless tobacco: Never  Vaping Use   Vaping Use: Never used  Substance and Sexual Activity   Alcohol use: Yes    Comment: occasional    Drug use: Yes    Types: Marijuana    Comment: RECENTLY   Sexual activity: Not Currently  Other Topics Concern   Not on file  Social History Narrative   Not on file   Social Determinants of Health   Financial Resource Strain: Not on file  Food Insecurity: Not on file  Transportation Needs: Not on file  Physical Activity: Not on file  Stress: Not on file  Social Connections: Not on file  Intimate Partner Violence: Not on file    Family History  Problem Relation Age of Onset   Breast cancer Cousin 25       maternal   Breast cancer Maternal Aunt 58   Lupus Other    Pancreatic cancer Other    Hypertension Other    Diabetes Other    Cancer Mother     No Known Allergies  Review of Systems  Constitutional: Negative.   HENT: Negative.    Eyes: Negative.   Respiratory: Negative.  Negative for cough and shortness of breath.   Cardiovascular: Negative.  Negative for chest pain, palpitations and leg swelling.  Gastrointestinal: Negative.  Negative for abdominal pain, constipation, diarrhea, heartburn, nausea and vomiting.  Genitourinary: Negative.  Negative for dysuria and flank pain.  Musculoskeletal: Negative.  Negative for joint pain and myalgias.  Skin: Negative.   Neurological: Negative.  Negative for dizziness, tingling, sensory change, speech change, focal weakness and headaches.  Endo/Heme/Allergies: Negative.   Psychiatric/Behavioral: Negative.  Negative for depression and suicidal ideas. The patient is not nervous/anxious.        Objective:   BP 120/78   Pulse 60   Ht 5\' 7"  (1.702 m)   Wt 150 lb 12.8 oz (68.4 kg)   LMP  (LMP Unknown)   SpO2 100%   BMI 23.62 kg/m   Vitals:   05/25/23 1427  BP: 120/78  Pulse: 60  Height: 5\' 7"  (1.702 m)  Weight: 150 lb 12.8 oz (68.4 kg)  SpO2: 100%  BMI (Calculated): 23.61    Physical Exam   No results found for any visits on 05/25/23.  Recent Results (from the past 2160 hour(s))  CBC with Differential      Status: None   Collection Time: 05/21/23  7:55 AM  Result Value Ref Range   WBC 4.7 4.0 - 10.5 K/uL   RBC 4.52 3.87 - 5.11 MIL/uL   Hemoglobin 13.3 12.0 - 15.0 g/dL   HCT 74.2 59.5 - 63.8 %   MCV 90.0 80.0 - 100.0 fL   MCH 29.4 26.0 - 34.0 pg   MCHC 32.7 30.0 - 36.0 g/dL   RDW 75.6 43.3 - 29.5 %   Platelets 243 150 - 400 K/uL   nRBC 0.0 0.0 - 0.2 %   Neutrophils Relative % 50 %   Neutro Abs 2.3 1.7 - 7.7 K/uL   Lymphocytes Relative 37 %   Lymphs Abs 1.7 0.7 - 4.0 K/uL   Monocytes Relative 9 %   Monocytes Absolute 0.4 0.1 - 1.0 K/uL   Eosinophils Relative 3 %   Eosinophils Absolute 0.2 0.0 - 0.5 K/uL   Basophils Relative 1 %   Basophils Absolute 0.0 0.0 - 0.1 K/uL   Immature Granulocytes 0 %   Abs Immature Granulocytes 0.01 0.00 - 0.07 K/uL    Comment: Performed at [facility], [address] [place], [place] [address]  Basic metabolic panel     Status: None   Collection Time: 05/21/23  7:55 AM  Result Value Ref Range   Sodium 140 135 - 145 mmol/L   Potassium 3.6 3.5 - 5.1 mmol/L   Chloride 102 98 - 111 mmol/L   CO2 25 22 - 32 mmol/L   Glucose, Bld 99 70 - 99 mg/dL    Comment: Glucose reference range applies only to samples taken after fasting for at least 8 hours.   BUN 9 6 - 20 mg/dL   Creatinine, Ser 6.60 0.44 - 1.00 mg/dL   Calcium 9.8 8.9 - 63.0 mg/dL   GFR, Estimated >16 >01 mL/min    Comment: (NOTE) Calculated using the CKD-EPI Creatinine Equation (2021)    Anion gap 13 5 - 15    Comment: Performed at [facility], [address] [place], [place] [address]  Sedimentation rate     Status: None   Collection Time: 05/21/23  7:55 AM  Result Value Ref Range   Sed Rate 6 0 - 22 mm/hr    Comment: Performed at [facility], [address], [place], [place] [address]      Assessment & Plan:  Patient will make an appointment with a neurosurgeon who is in network.  Meanwhile continue taking all the medications as such. Problem List Items Addressed  This Visit     Paresthesia of both feet - Primary   Other complicated headache syndrome    Return in about 3 months (around 08/25/2023).   Total time spent: 25 minutes  Margaretann Loveless, MD  05/25/2023   This document may have been prepared by [facility] Voice Recognition software and as such may include unintentional dictation errors.

## 2023-06-25 ENCOUNTER — Ambulatory Visit: Payer: PRIVATE HEALTH INSURANCE | Admitting: Nurse Practitioner

## 2023-08-24 ENCOUNTER — Other Ambulatory Visit: Payer: PRIVATE HEALTH INSURANCE

## 2023-08-24 ENCOUNTER — Other Ambulatory Visit: Payer: Self-pay | Admitting: Internal Medicine

## 2023-08-24 DIAGNOSIS — E782 Mixed hyperlipidemia: Secondary | ICD-10-CM

## 2023-08-24 DIAGNOSIS — E559 Vitamin D deficiency, unspecified: Secondary | ICD-10-CM

## 2023-08-24 DIAGNOSIS — E538 Deficiency of other specified B group vitamins: Secondary | ICD-10-CM

## 2023-08-24 DIAGNOSIS — R7303 Prediabetes: Secondary | ICD-10-CM

## 2023-08-24 DIAGNOSIS — Z1329 Encounter for screening for other suspected endocrine disorder: Secondary | ICD-10-CM

## 2023-08-24 DIAGNOSIS — I10 Essential (primary) hypertension: Secondary | ICD-10-CM

## 2023-08-25 LAB — HEMOGLOBIN A1C
Est. average glucose Bld gHb Est-mCnc: 88 mg/dL
Hgb A1c MFr Bld: 4.7 % — ABNORMAL LOW (ref 4.8–5.6)

## 2023-08-25 LAB — LIPID PANEL
Chol/HDL Ratio: 3.9 {ratio} (ref 0.0–4.4)
Cholesterol, Total: 236 mg/dL — ABNORMAL HIGH (ref 100–199)
HDL: 60 mg/dL (ref >39)
LDL Chol Calc (NIH): 167 mg/dL — ABNORMAL HIGH (ref 0–99)
Triglycerides: 55 mg/dL (ref 0–149)
VLDL Cholesterol Cal: 9 mg/dL (ref 5–40)

## 2023-08-25 LAB — CMP14+EGFR
ALT: 10 [IU]/L (ref 0–32)
AST: 16 [IU]/L (ref 0–40)
Albumin: 4.4 g/dL (ref 3.9–4.9)
Alkaline Phosphatase: 71 [IU]/L (ref 44–121)
BUN/Creatinine Ratio: 17 (ref 12–28)
BUN: 14 mg/dL (ref 8–27)
Bilirubin Total: 0.9 mg/dL (ref 0.0–1.2)
CO2: 25 mmol/L (ref 20–29)
Calcium: 10.4 mg/dL — ABNORMAL HIGH (ref 8.7–10.3)
Chloride: 104 mmol/L (ref 96–106)
Creatinine, Ser: 0.81 mg/dL (ref 0.57–1.00)
Globulin, Total: 2.7 g/dL (ref 1.5–4.5)
Glucose: 83 mg/dL (ref 70–99)
Potassium: 4.5 mmol/L (ref 3.5–5.2)
Sodium: 142 mmol/L (ref 134–144)
Total Protein: 7.1 g/dL (ref 6.0–8.5)
eGFR: 83 mL/min/{1.73_m2} (ref >59)

## 2023-08-25 LAB — VITAMIN B12: Vitamin B-12: 817 pg/mL (ref 232–1245)

## 2023-08-25 LAB — TSH: TSH: 0.946 u[IU]/mL (ref 0.450–4.500)

## 2023-08-25 LAB — VITAMIN D 25 HYDROXY (VIT D DEFICIENCY, FRACTURES): Vit D, 25-Hydroxy: 57.8 ng/mL (ref 30.0–100.0)

## 2023-08-27 ENCOUNTER — Ambulatory Visit: Payer: PRIVATE HEALTH INSURANCE | Admitting: Cardiology

## 2023-09-03 ENCOUNTER — Encounter: Payer: Self-pay | Admitting: Cardiology

## 2023-09-03 ENCOUNTER — Ambulatory Visit: Payer: PRIVATE HEALTH INSURANCE | Admitting: Cardiology

## 2023-09-03 ENCOUNTER — Other Ambulatory Visit: Payer: Self-pay | Admitting: Cardiology

## 2023-09-03 VITALS — BP 115/70 | HR 64 | Ht 67.0 in | Wt 149.2 lb

## 2023-09-03 DIAGNOSIS — Z0001 Encounter for general adult medical examination with abnormal findings: Secondary | ICD-10-CM

## 2023-09-03 DIAGNOSIS — E782 Mixed hyperlipidemia: Secondary | ICD-10-CM

## 2023-09-03 DIAGNOSIS — Z1211 Encounter for screening for malignant neoplasm of colon: Secondary | ICD-10-CM | POA: Diagnosis not present

## 2023-09-03 DIAGNOSIS — I671 Cerebral aneurysm, nonruptured: Secondary | ICD-10-CM | POA: Insufficient documentation

## 2023-09-03 MED ORDER — NEXLETOL 180 MG PO TABS: 180.0000 mg | ORAL_TABLET | Freq: Every day | ORAL | 6 refills | Status: DC

## 2023-09-03 NOTE — Progress Notes (Signed)
Complete physical exam  Patient: Melanie Newton   DOB: January 19, 1961   62 y.o. Female  MRN: [id number]  Subjective:    Chief Complaint  Patient presents with   Annual Exam    NPE    Melanie Newton is a 62 y.o. female who presents today for a complete physical exam. She reports consuming a general diet. The patient does not participate in regular exercise at present. She generally feels well. She reports sleeping well. She does not have additional problems to discuss today.    Most recent fall risk assessment:     No data to display           Most recent depression screenings:     No data to display          Vision:Within last year and Dental: No current dental problems and Receives regular dental care  Past Medical History:  Diagnosis Date   Allergic rhinitis    Anxiety    Asthma    COVID-19 virus infection 11/2020   Depression    Fibroids 2002   High cholesterol    Hyperlipidemia    Hypothyroidism    Overweight (BMI 25.0-29.9)    Overweight (BMI 25.0-29.9)    Prediabetes 03/26/2023   Vitamin D deficiency     Past Surgical History:  Procedure Laterality Date   ECTOPIC PREGNANCY SURGERY  2000   Laparotomy   ROBOTIC ASSISTED TOTAL HYSTERECTOMY Right 10/25/2020   Procedure: XI ROBOTIC ASSISTED TOTAL HYSTERECTOMY and bilateral salpingectomy; Vaginal hysterectomy;  Surgeon: Hildred Laser, MD;  Location: [facility] ORS;  Service: Gynecology;  Laterality: Right;   UTERINE FIBROID SURGERY     X2    Family History  Problem Relation Age of Onset   Breast cancer Cousin 16       maternal   Breast cancer Maternal Aunt 58   Lupus Other    Pancreatic cancer Other    Hypertension Other    Diabetes Other    Cancer Mother     Social History   Socioeconomic History   Marital status: Married    Spouse name: Not on file   Number of children: Not on file   Years of education: Not on file   Highest education level: Not on file  Occupational History   Not  on file  Tobacco Use   Smoking status: Never   Smokeless tobacco: Never  Vaping Use   Vaping status: Never Used  Substance and Sexual Activity   Alcohol use: Yes    Comment: occasional   Drug use: Yes    Types: Marijuana    Comment: RECENTLY   Sexual activity: Not Currently  Other Topics Concern   Not on file  Social History Narrative   Not on file   Social Determinants of Health   Financial Resource Strain: Not on file  Food Insecurity: Not on file  Transportation Needs: Not on file  Physical Activity: Not on file  Stress: Not on file  Social Connections: Not on file  Intimate Partner Violence: Not on file    Outpatient Medications Prior to Visit  Medication Sig   Aspirin-Acetaminophen-Caffeine (GOODYS EXTRA STRENGTH PO) Take 2 packets by mouth as needed (headaches).   citalopram (CELEXA) 40 MG tablet Take 40 mg by mouth at bedtime.   ibuprofen (ADVIL) 600 MG tablet Take 600 mg by mouth as needed for headache or moderate pain.   [DISCONTINUED] gabapentin (NEURONTIN) 300 MG capsule Take 1 capsule (300 mg total) by mouth 2 (  two) times daily. (Patient not taking: Reported on 05/25/2023)   No facility-administered medications prior to visit.    Review of Systems  Constitutional: Negative.   HENT: Negative.    Eyes: Negative.   Respiratory: Negative.  Negative for shortness of breath.   Cardiovascular: Negative.  Negative for chest pain.  Gastrointestinal: Negative.  Negative for abdominal pain, constipation and diarrhea.  Genitourinary: Negative.   Musculoskeletal:  Negative for joint pain and myalgias.  Skin: Negative.   Neurological: Negative.  Negative for dizziness and headaches.  Endo/Heme/Allergies: Negative.   All other systems reviewed and are negative.       Objective:     BP 115/70   Pulse 64   Ht 5\' 7"  (1.702 m)   Wt 149 lb 3.2 oz (67.7 kg)   LMP  (LMP Unknown)   SpO2 98%   BMI 23.37 kg/m  BP Readings from Last 3 Encounters:  09/03/23 115/70   05/25/23 120/78  05/21/23 (!) 168/94      Physical Exam Vitals and nursing note reviewed.  Constitutional:      Appearance: Normal appearance. She is normal weight.  HENT:     Head: Normocephalic and atraumatic.     Nose: Nose normal.     Mouth/Throat:     Mouth: Mucous membranes are moist.  Eyes:     Extraocular Movements: Extraocular movements intact.     Conjunctiva/sclera: Conjunctivae normal.     Pupils: Pupils are equal, round, and reactive to light.  Cardiovascular:     Rate and Rhythm: Normal rate and regular rhythm.     Pulses: Normal pulses.     Heart sounds: Normal heart sounds.  Pulmonary:     Effort: Pulmonary effort is normal.     Breath sounds: Normal breath sounds.  Abdominal:     General: Abdomen is flat. Bowel sounds are normal.     Palpations: Abdomen is soft.  Musculoskeletal:        General: Normal range of motion.     Cervical back: Normal range of motion.  Skin:    General: Skin is warm and dry.  Neurological:     General: No focal deficit present.     Mental Status: She is alert and oriented to person, place, and time.  Psychiatric:        Mood and Affect: Mood normal.        Behavior: Behavior normal.        Thought Content: Thought content normal.        Judgment: Judgment normal.      No results found for any visits on 09/03/23.  Recent Results (from the past 2160 hour(s))  CMP14+EGFR     Status: Abnormal   Collection Time: 08/24/23 11:07 AM  Result Value Ref Range   Glucose 83 70 - 99 mg/dL   BUN 14 8 - 27 mg/dL   Creatinine, Ser 5.28 0.57 - 1.00 mg/dL   eGFR 83 >41 LK/GMW/1.02   BUN/Creatinine Ratio 17 12 - 28   Sodium 142 134 - 144 mmol/L   Potassium 4.5 3.5 - 5.2 mmol/L   Chloride 104 96 - 106 mmol/L   CO2 25 20 - 29 mmol/L   Calcium 10.4 (H) 8.7 - 10.3 mg/dL   Total Protein 7.1 6.0 - 8.5 g/dL   Albumin 4.4 3.9 - 4.9 g/dL   Globulin, Total 2.7 1.5 - 4.5 g/dL   Bilirubin Total 0.9 0.0 - 1.2 mg/dL   Alkaline Phosphatase 71  44 -  121 IU/L   AST 16 0 - 40 IU/L   ALT 10 0 - 32 IU/L  Lipid Profile     Status: Abnormal   Collection Time: 08/24/23 11:07 AM  Result Value Ref Range   Cholesterol, Total 236 (H) 100 - 199 mg/dL   Triglycerides 55 0 - 149 mg/dL   HDL 60 >47 mg/dL   VLDL Cholesterol Cal 9 5 - 40 mg/dL   LDL Chol Calc (NIH) 829 (H) 0 - 99 mg/dL   Chol/HDL Ratio 3.9 0.0 - 4.4 ratio    Comment:                                   T. Chol/HDL Ratio                                             Men  Women                               1/2 Avg.Risk  3.4    3.3                                   Avg.Risk  5.0    4.4                                2X Avg.Risk  9.6    7.1                                3X Avg.Risk 23.4   11.0   Hemoglobin A1c     Status: Abnormal   Collection Time: 08/24/23 11:07 AM  Result Value Ref Range   Hgb A1c MFr Bld 4.7 (L) 4.8 - 5.6 %    Comment:          Prediabetes: 5.7 - 6.4          Diabetes: >6.4          Glycemic control for adults with diabetes: <7.0    Est. average glucose Bld gHb Est-mCnc 88 mg/dL  TSH     Status: None   Collection Time: 08/24/23 11:07 AM  Result Value Ref Range   TSH 0.946 0.450 - 4.500 uIU/mL  Vitamin B12     Status: None   Collection Time: 08/24/23 11:07 AM  Result Value Ref Range   Vitamin B-12 817 232 - 1,245 pg/mL  Vitamin D (25 hydroxy)     Status: None   Collection Time: 08/24/23 11:07 AM  Result Value Ref Range   Vit D, 25-Hydroxy 57.8 30.0 - 100.0 ng/mL    Comment: Vitamin D deficiency has been defined by the Institute of Medicine and an Endocrine Society practice guideline as a level of serum 25-OH vitamin D less than 20 ng/mL (1,2). The Endocrine Society went on to further define vitamin D insufficiency as a level between 21 and 29 ng/mL (2). 1. IOM (Institute of Medicine). 2010. Dietary reference    intakes for calcium and D. [place]: The    [employer]. 2. Holick MF, Binkley  [place], Bischoff-Ferrari HA, et al.     Evaluation, treatment, and prevention of vitamin D    deficiency: an Endocrine Society clinical practice    guideline. JCEM. 2011 Jul; 96(7):1911-30.         Assessment & Plan:    Routine Health Maintenance and Physical Exam  LDL elevated. Did not tolerate atorvastatin. Will try Nexletol.   Immunization History  Administered Date(s) Administered   Influenza, Mdck, Trivalent,PF 6+ MOS(egg free) 08/19/2023    Health Maintenance  Topic Date Due   HIV Screening  Never done   DTaP/Tdap/Td (1 - Tdap) Never done   Zoster Vaccines- Shingrix (1 of 2) Never done   COVID-19 Vaccine (1 - 2023-24 season) Never done   MAMMOGRAM  04/28/2025   Colonoscopy  08/26/2027   INFLUENZA VACCINE  Completed   Hepatitis C Screening  Completed   HPV VACCINES  Aged Out    Discussed health benefits of physical activity, and encouraged her to engage in regular exercise appropriate for her age and condition.  Problem List Items Addressed This Visit       Other   Mixed hyperlipidemia - Primary   Relevant Medications   Bempedoic Acid (NEXLETOL) 180 MG TABS   Other Visit Diagnoses     Colon cancer screening       Relevant Orders   Ambulatory referral to Gastroenterology      Return in about 6 months (around 03/03/2024) for with fasting lab work prior.     Marisue Ivan, NP  09/03/2023   This document may have been prepared by Dragon Voice Recognition software and as such may include unintentional dictation errors.

## 2023-09-06 ENCOUNTER — Other Ambulatory Visit: Payer: Self-pay | Admitting: Cardiology

## 2023-09-08 ENCOUNTER — Telehealth: Payer: Self-pay | Admitting: Internal Medicine

## 2023-09-08 NOTE — Telephone Encounter (Signed)
Patient left VM that the [employer] sent in is not covered by her insurance and they need an alternative sent in. Please advise.

## 2023-09-09 ENCOUNTER — Other Ambulatory Visit: Payer: Self-pay | Admitting: Cardiology

## 2023-09-09 MED ORDER — PRAVASTATIN SODIUM 20 MG PO TABS: 20.0000 mg | ORAL_TABLET | Freq: Every evening | ORAL | 11 refills | Status: DC

## 2023-10-01 ENCOUNTER — Other Ambulatory Visit: Payer: Self-pay

## 2023-10-01 MED ORDER — CITALOPRAM HYDROBROMIDE 40 MG PO TABS: 40.0000 mg | ORAL_TABLET | Freq: Every day | ORAL | 2 refills | Status: DC

## 2024-01-04 ENCOUNTER — Encounter: Payer: Self-pay | Admitting: *Deleted

## 2024-01-11 ENCOUNTER — Ambulatory Visit: Payer: BC Managed Care – PPO | Admitting: Certified Registered"

## 2024-01-11 ENCOUNTER — Other Ambulatory Visit: Payer: Self-pay

## 2024-01-11 ENCOUNTER — Encounter: Payer: Self-pay | Admitting: *Deleted

## 2024-01-11 ENCOUNTER — Encounter
Admission: RE | Disposition: A | Discharge status: Home / Self Care | Payer: Self-pay | Source: Home / Self Care | Attending: Gastroenterology

## 2024-01-11 ENCOUNTER — Ambulatory Visit
Admission: RE | Admit: 2024-01-11 | Discharge: 2024-01-11 | Disposition: A | Discharge status: Home / Self Care | Payer: BC Managed Care – PPO | Attending: Gastroenterology | Admitting: Gastroenterology

## 2024-01-11 DIAGNOSIS — Z1211 Encounter for screening for malignant neoplasm of colon: Secondary | ICD-10-CM | POA: Insufficient documentation

## 2024-01-11 DIAGNOSIS — Z800 Family history of malignant neoplasm of digestive organs: Secondary | ICD-10-CM | POA: Insufficient documentation

## 2024-01-11 DIAGNOSIS — K5730 Diverticulosis of large intestine without perforation or abscess without bleeding: Secondary | ICD-10-CM | POA: Diagnosis not present

## 2024-01-11 DIAGNOSIS — J45909 Unspecified asthma, uncomplicated: Secondary | ICD-10-CM | POA: Insufficient documentation

## 2024-01-11 DIAGNOSIS — K640 First degree hemorrhoids: Secondary | ICD-10-CM | POA: Insufficient documentation

## 2024-01-11 DIAGNOSIS — Z83719 Family history of colon polyps, unspecified: Secondary | ICD-10-CM | POA: Diagnosis not present

## 2024-01-11 DIAGNOSIS — K219 Gastro-esophageal reflux disease without esophagitis: Secondary | ICD-10-CM | POA: Diagnosis not present

## 2024-01-11 HISTORY — PX: COLONOSCOPY WITH PROPOFOL: SHX5780

## 2024-01-11 HISTORY — DX: Gastro-esophageal reflux disease without esophagitis: K21.9

## 2024-01-11 SURGERY — COLONOSCOPY WITH PROPOFOL
Anesthesia: General

## 2024-01-11 MED ORDER — EPHEDRINE SULFATE-NACL 50-0.9 MG/10ML-% IV SOSY
PREFILLED_SYRINGE | INTRAVENOUS | Status: DC | PRN
  Administered 2024-01-11: 5 mg via INTRAVENOUS

## 2024-01-11 MED ORDER — PROPOFOL 10 MG/ML IV BOLUS
INTRAVENOUS | Status: AC
  Filled 2024-01-11: qty 40

## 2024-01-11 MED ORDER — LIDOCAINE HCL (PF) 2 % IJ SOLN
INTRAMUSCULAR | Status: AC
  Filled 2024-01-11: qty 5

## 2024-01-11 MED ORDER — PROPOFOL 10 MG/ML IV BOLUS
INTRAVENOUS | Status: DC | PRN
  Administered 2024-01-11: 150 ug/kg/min via INTRAVENOUS
  Administered 2024-01-11: 100 mg via INTRAVENOUS

## 2024-01-11 MED ORDER — LIDOCAINE HCL (CARDIAC) PF 100 MG/5ML IV SOSY
PREFILLED_SYRINGE | INTRAVENOUS | Status: DC | PRN
  Administered 2024-01-11: 100 mg via INTRAVENOUS

## 2024-01-11 MED ORDER — EPHEDRINE 5 MG/ML INJ
INTRAVENOUS | Status: AC
  Filled 2024-01-11: qty 5

## 2024-01-11 MED ORDER — PHENYLEPHRINE 80 MCG/ML (10ML) SYRINGE FOR IV PUSH (FOR BLOOD PRESSURE SUPPORT)
PREFILLED_SYRINGE | INTRAVENOUS | Status: AC
  Filled 2024-01-11: qty 10

## 2024-01-11 MED ORDER — SODIUM CHLORIDE 0.9 % IV SOLN: INTRAVENOUS | Status: DC

## 2024-01-11 NOTE — Transfer of Care (Signed)
Immediate Anesthesia Transfer of Care Note  Patient: Koda Defrank  Procedure(s) Performed: COLONOSCOPY WITH PROPOFOL  Patient Location: Endoscopy Unit  Anesthesia Type:General  Level of Consciousness: awake, alert , and oriented  Airway & Oxygen Therapy: Patient Spontanous Breathing  Post-op Assessment: Report given to RN and Post -op Vital signs reviewed and stable  Post vital signs: Reviewed and stable  Last Vitals:  Vitals Value Taken Time  BP 109/71 01/11/24 0929  Temp 35.8 0929  Pulse 74 0929  Resp 18 01/11/24 0929  SpO2 98 0929  Vitals shown include unfiled device data.  Last Pain:  Vitals:   01/11/24 0851  TempSrc: Temporal  PainSc: 0-No pain         Complications: No notable events documented.

## 2024-01-11 NOTE — H&P (Signed)
Outpatient short stay form Pre-procedure 01/11/2024  Regis Bill, MD  Primary Physician: Margaretann Loveless, MD  Reason for visit:  Screening  History of present illness:    63 y/o lady with history of HLD< depression, and vitamin D deficiency here for screening colonoscopy due to family history of polyps (or cancer?). History of surgery for fibroid surgery. No blood thinners.    No current facility-administered medications for this encounter.  Medications Prior to Admission  Medication Sig Dispense Refill Last Dose/Taking   cholecalciferol (VITAMIN D3) 10 MCG (400 UNIT) TABS tablet Take 400 Units by mouth daily.   Taking   citalopram (CELEXA) 40 MG tablet Take 1 tablet (40 mg total) by mouth at bedtime. 90 tablet 2 01/10/2024   pravastatin (PRAVACHOL) 20 MG tablet Take 1 tablet (20 mg total) by mouth every evening. (Patient not taking: Reported on 01/11/2024) 30 tablet 11 Not Taking   Aspirin-Acetaminophen-Caffeine (GOODYS EXTRA STRENGTH PO) Take 2 packets by mouth as needed (headaches).      ibuprofen (ADVIL) 600 MG tablet Take 600 mg by mouth as needed for headache or moderate pain.        Allergies  Allergen Reactions   Atorvastatin Other (See Comments)    Peripheral neuropathy     Past Medical History:  Diagnosis Date   Allergic rhinitis    Anxiety    Asthma    COVID-19 virus infection 11/2020   Depression    Fibroids 2002   GERD (gastroesophageal reflux disease)    High cholesterol    Hyperlipidemia    Hypothyroidism    Overweight (BMI 25.0-29.9)    Overweight (BMI 25.0-29.9)    Prediabetes 03/26/2023   Vitamin D deficiency     Review of systems:  Otherwise negative.    Physical Exam  Gen: Alert, oriented. Appears stated age.  HEENT: PERRLA. Lungs: No respiratory distress CV: RRR Abd: soft, benign, no masses Ext: No edema    Planned procedures: Proceed with colonoscopy. The patient understands the nature of the planned procedure, indications,  risks, alternatives and potential complications including but not limited to bleeding, infection, perforation, damage to internal organs and possible oversedation/side effects from anesthesia. The patient agrees and gives consent to proceed.  Please refer to procedure notes for findings, recommendations and patient disposition/instructions.     Regis Bill, MD [facility]

## 2024-01-11 NOTE — Anesthesia Preprocedure Evaluation (Signed)
Anesthesia Evaluation  Patient identified by MRN, date of birth, ID band Patient awake    Reviewed: Allergy & Precautions, H&P , NPO status , Patient's Chart, lab work & pertinent test results, reviewed documented beta blocker date and time   Airway Mallampati: I  TM Distance: >3 FB Neck ROM: full    Dental  (+) Dental Advidsory Given, Caps, Implants, Missing, Teeth Intact   Pulmonary neg shortness of breath, asthma , neg COPD, neg recent URI, Patient abstained from smoking.Not current smoker   Pulmonary exam normal breath sounds clear to auscultation       Cardiovascular Exercise Tolerance: Good negative cardio ROS Normal cardiovascular exam Rhythm:regular Rate:Normal     Neuro/Psych  PSYCHIATRIC DISORDERS Anxiety Depression    negative neurological ROS     GI/Hepatic negative GI ROS, Neg liver ROS,,,  Endo/Other  negative endocrine ROS    Renal/GU negative Renal ROS  negative genitourinary   Musculoskeletal   Abdominal   Peds  Hematology negative hematology ROS (+)   Anesthesia Other Findings Past Medical History: No date: Allergic rhinitis No date: Anxiety No date: Asthma 11/2020: COVID-19 virus infection No date: Depression 2002: Fibroids No date: GERD (gastroesophageal reflux disease) No date: High cholesterol No date: Hyperlipidemia No date: Hypothyroidism No date: Overweight (BMI 25.0-29.9) No date: Overweight (BMI 25.0-29.9) 03/26/2023: Prediabetes No date: Vitamin D deficiency   Reproductive/Obstetrics negative OB ROS                             Anesthesia Physical Anesthesia Plan  ASA: 2  Anesthesia Plan: General   Post-op Pain Management:    Induction: Intravenous  PONV Risk Score and Plan: 2 and Propofol infusion and TIVA  Airway Management Planned: Natural Airway and Nasal Cannula  Additional Equipment:   Intra-op Plan:   Post-operative Plan:    Informed Consent: I have reviewed the patients History and Physical, chart, labs and discussed the procedure including the risks, benefits and alternatives for the proposed anesthesia with the patient or authorized representative who has indicated his/her understanding and acceptance.     Dental Advisory Given  Plan Discussed with: Anesthesiologist, CRNA and Surgeon  Anesthesia Plan Comments:        Anesthesia Quick Evaluation

## 2024-01-11 NOTE — Op Note (Signed)
[facility] Patient Name: Melanie Newton Procedure Date: 01/11/2024 9:04 AM MRN: [id number] Account #: [id number] Date of Birth: 02/02/1961 Admit Type: Outpatient Age: 63 Room: [facility] ENDO ROOM 1 Gender: Female Note Status: Supervisor Override Instrument Name: Prentice Docker [id number] Procedure:             Colonoscopy Indications:           Colon cancer screening in patient at increased risk:                         Family history of 1st-degree relative with colon                         polyps, Screening in patient at increased risk: Family                         history of 1st-degree relative with colorectal cancer Providers:             Eather Colas MD, MD Referring MD:          Eather Colas MD, MD (Referring MD), Margaretann Loveless, MD (Referring MD) Medicines:             Monitored Anesthesia Care Complications:         No immediate complications. Procedure:             Pre-Anesthesia Assessment:                        - Prior to the procedure, a History and Physical was                         performed, and patient medications and allergies were                         reviewed. The patient is competent. The risks and                         benefits of the procedure and the sedation options and                         risks were discussed with the patient. All questions                         were answered and informed consent was obtained.                         Patient identification and proposed procedure were                         verified by the physician, the nurse, the                         anesthesiologist, the anesthetist and the technician                         in the endoscopy suite. Mental Status Examination:  alert and oriented. Airway Examination: normal                         oropharyngeal airway and neck mobility. Respiratory                         Examination:  clear to auscultation. CV Examination:                         normal. Prophylactic Antibiotics: The patient does not                         require prophylactic antibiotics. Prior                         Anticoagulants: The patient has taken no anticoagulant                         or antiplatelet agents. ASA Grade Assessment: II - A                         patient with mild systemic disease. After reviewing                         the risks and benefits, the patient was deemed in                         satisfactory condition to undergo the procedure. The                         anesthesia plan was to use monitored anesthesia care                         (MAC). Immediately prior to administration of                         medications, the patient was re-assessed for adequacy                         to receive sedatives. The heart rate, respiratory                         rate, oxygen saturations, blood pressure, adequacy of                         pulmonary ventilation, and response to care were                         monitored throughout the procedure. The physical                         status of the patient was re-assessed after the                         procedure.                        After obtaining informed consent, the colonoscope was  passed under direct vision. Throughout the procedure,                         the patient's blood pressure, pulse, and oxygen                         saturations were monitored continuously. The                         Colonoscope was introduced through the anus and                         advanced to the the cecum, identified by appendiceal                         orifice and ileocecal valve. The colonoscopy was                         performed without difficulty. The patient tolerated                         the procedure well. The quality of the bowel                         preparation was good. The ileocecal  valve, appendiceal                         orifice, and rectum were photographed. Findings:      The perianal and digital rectal examinations were normal.      Scattered large-mouthed and small-mouthed diverticula were found in the       sigmoid colon, descending colon, transverse colon, hepatic flexure and       ascending colon.      Internal hemorrhoids were found during retroflexion. The hemorrhoids       were Grade I (internal hemorrhoids that do not prolapse).      The exam was otherwise without abnormality on direct and retroflexion       views. Impression:            - Diverticulosis in the sigmoid colon, in the                         descending colon, in the transverse colon, at the                         hepatic flexure and in the ascending colon.                        - Internal hemorrhoids.                        - The examination was otherwise normal on direct and                         retroflexion views.                        - No specimens collected. Recommendation:        - Discharge patient to home.                        -  Resume previous diet.                        - Continue present medications.                        - Repeat colonoscopy in 5 years for screening purposes.                        - Return to referring physician as previously                         scheduled. Procedure Code(s):     --- Professional ---                        Z6109, Colorectal cancer screening; colonoscopy on                         individual at high risk Diagnosis Code(s):     --- Professional ---                        Z83.71, Family history of colonic polyps                        K64.0, First degree hemorrhoids                        K57.30, Diverticulosis of large intestine without                         perforation or abscess without bleeding CPT copyright 2022 American Medical Association. All rights reserved. The codes documented in this report are preliminary and  upon coder review may  be revised to meet current compliance requirements. Eather Colas MD, MD 01/11/2024 9:30:59 AM Number of Addenda: 0 Note Initiated On: 01/11/2024 9:04 AM Scope Withdrawal Time: 0 hours 6 minutes 6 seconds  Total Procedure Duration: 0 hours 9 minutes 28 seconds  Estimated Blood Loss:  Estimated blood loss: none.      [facility]

## 2024-01-11 NOTE — Anesthesia Postprocedure Evaluation (Signed)
Anesthesia Post Note  Patient: Melanie Newton  Procedure(s) Performed: COLONOSCOPY WITH PROPOFOL  Patient location during evaluation: Endoscopy Anesthesia Type: General Level of consciousness: awake and alert Pain management: pain level controlled Vital Signs Assessment: post-procedure vital signs reviewed and stable Respiratory status: spontaneous breathing, nonlabored ventilation, respiratory function stable and patient connected to nasal cannula oxygen Cardiovascular status: blood pressure returned to baseline and stable Postop Assessment: no apparent nausea or vomiting Anesthetic complications: no   No notable events documented.   Last Vitals:  Vitals:   01/11/24 0851 01/11/24 0930  BP: (!) 126/90   Pulse: (!) 57   Resp: 16   Temp: (!) 35.9 C (!) 35.8 C  SpO2: 100%     Last Pain:  Vitals:   01/11/24 0950  TempSrc:   PainSc: 0-No pain                 Lenard Simmer

## 2024-01-11 NOTE — Interval H&P Note (Signed)
History and Physical Interval Note:  01/11/2024 8:58 AM  Melanie Newton  has presented today for surgery, with the diagnosis of Z80.0 (ICD-10-CM) - Family hx of colon cancer.  The various methods of treatment have been discussed with the patient and family. After consideration of risks, benefits and other options for treatment, the patient has consented to  Procedure(s): COLONOSCOPY WITH PROPOFOL (N/A) as a surgical intervention.  The patient's history has been reviewed, patient examined, no change in status, stable for surgery.  I have reviewed the patient's chart and labs.  Questions were answered to the patient's satisfaction.     Regis Bill  Newton to proceed with colonoscopy

## 2024-02-21 IMAGING — MG MM DIGITAL SCREENING BILAT W/ TOMO AND CAD
8 series · 8 of 24 positions shown · non-contrast
Comparison: Previous exam(s).

CLINICAL DATA: Screening.

EXAM:
DIGITAL SCREENING BILATERAL MAMMOGRAM WITH TOMOSYNTHESIS AND CAD
TECHNIQUE: Bilateral screening digital craniocaudal and mediolateral oblique
mammograms were obtained. Bilateral screening digital breast
tomosynthesis was performed. The images were evaluated with
computer-aided detection.

[R MLO synth-2D]
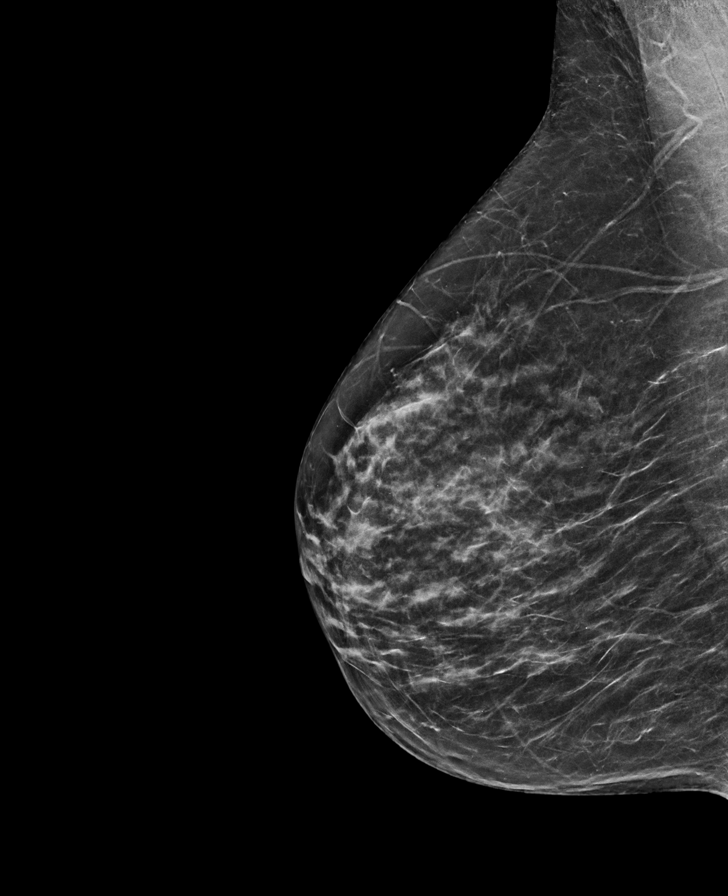

[L MLO synth-2D]
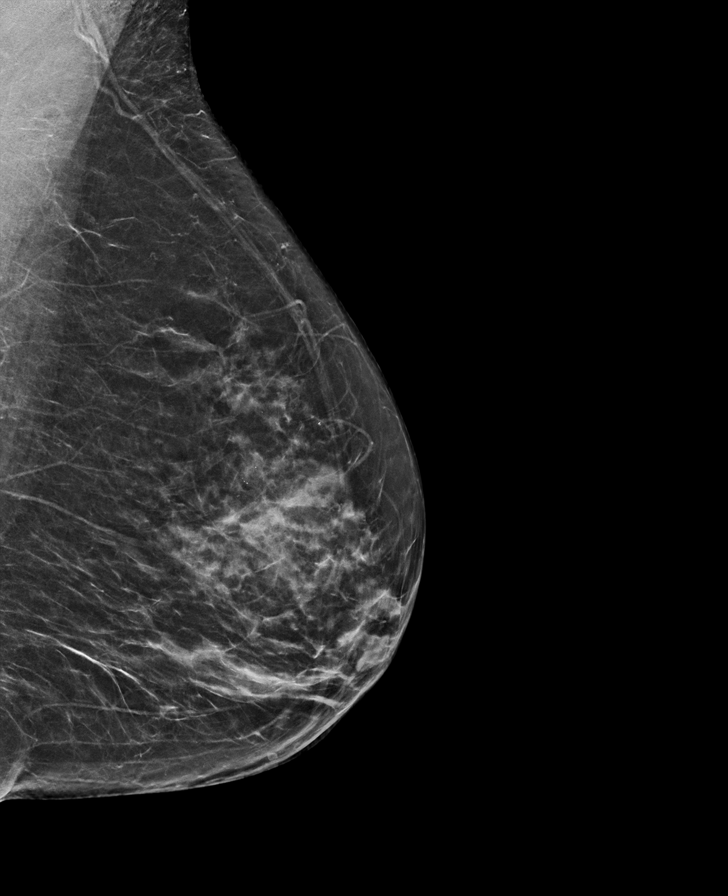

[L CC synth-2D]
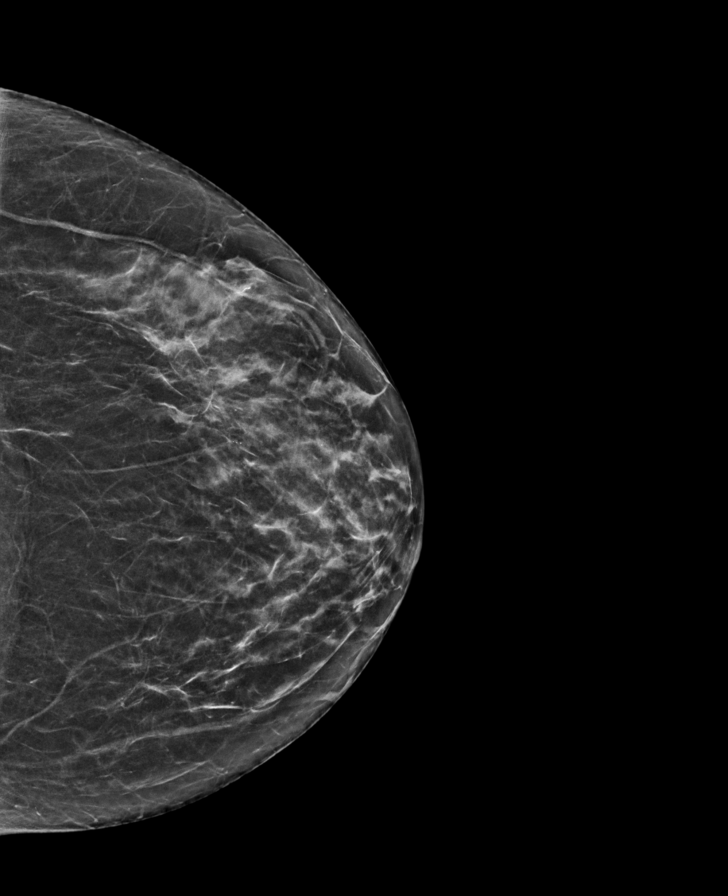

[R CC synth-2D]
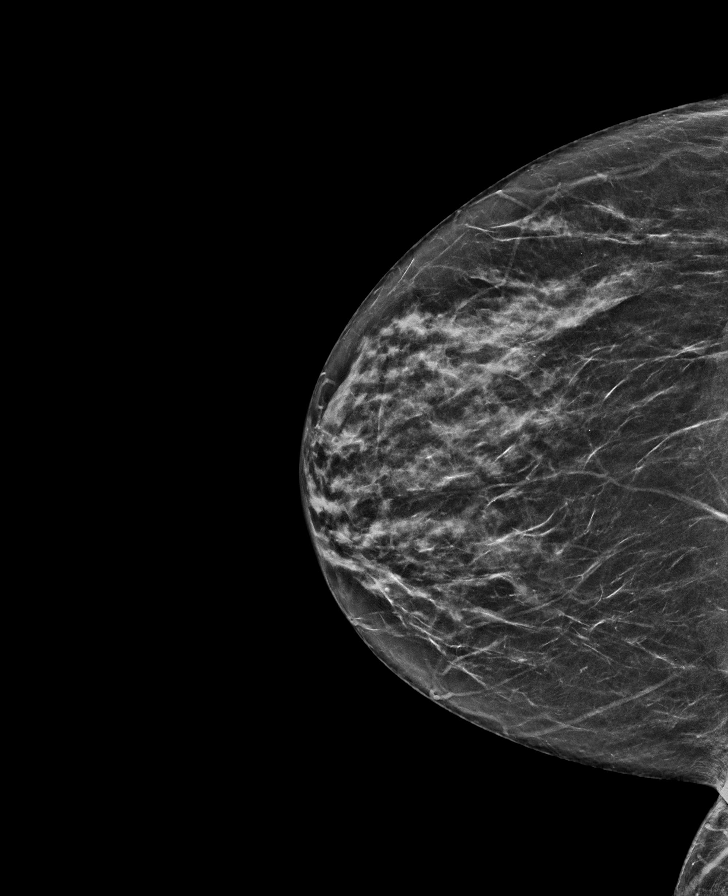

[R CC tomo · tomo slice 31/60.0]
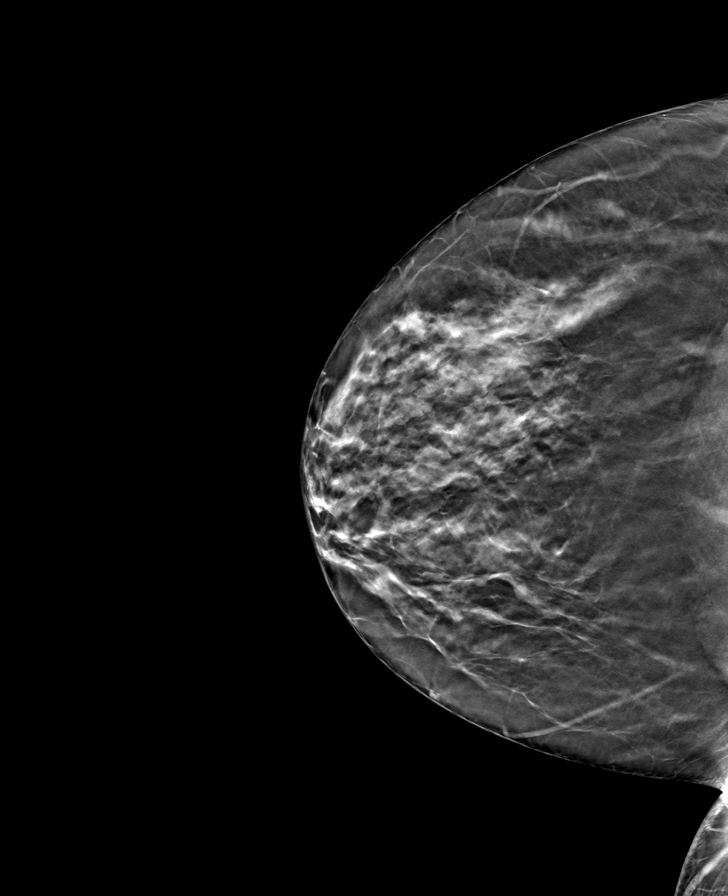

[L CC tomo · tomo slice 29/56.0]
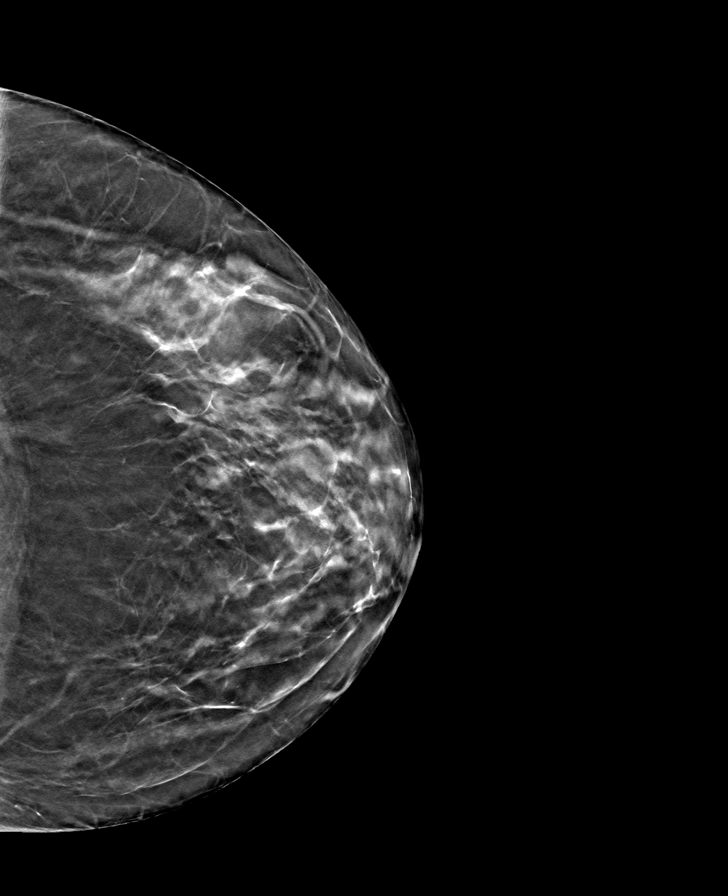

[L MLO tomo · tomo slice 31/62.0]
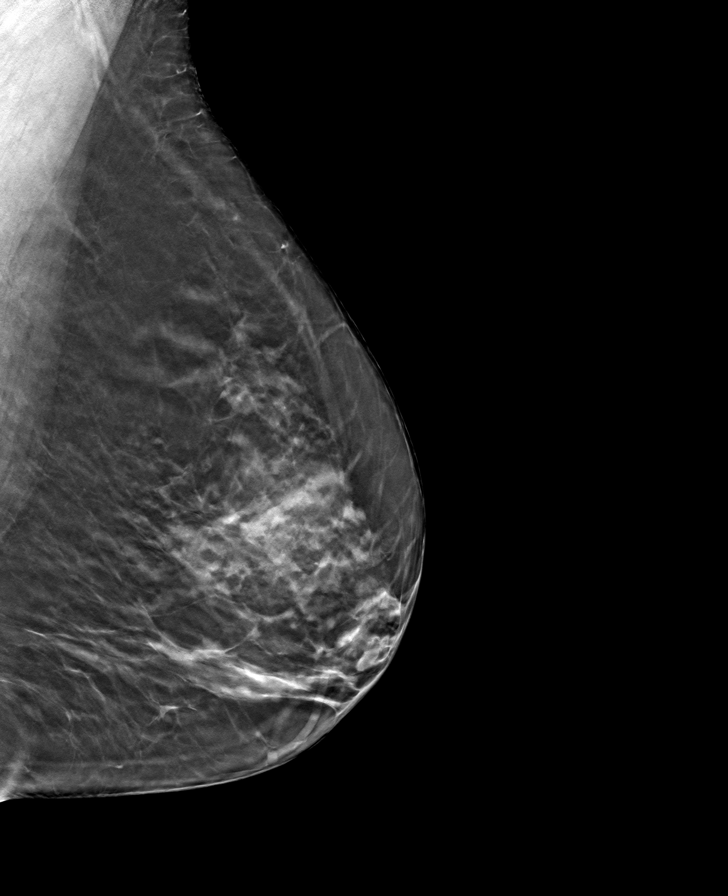

[R MLO tomo · tomo slice 30/59.0]
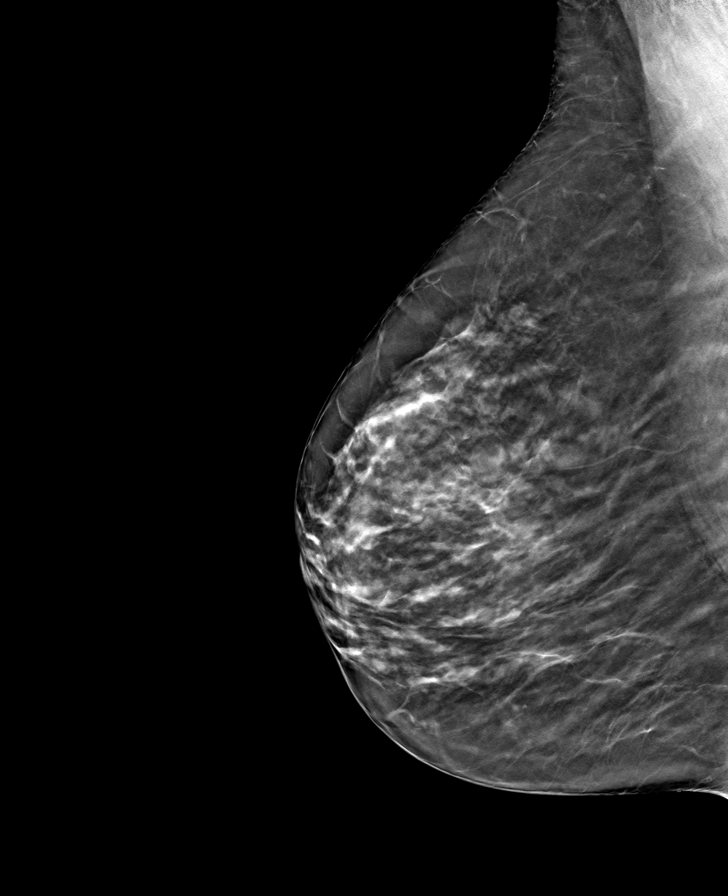

[8 of 24 positions shown; findings below may reference images not displayed]

ACR Breast Density Category c: The breast tissue is heterogeneously
dense, which may obscure small masses.
FINDINGS: There are no findings suspicious for malignancy.
IMPRESSION: No mammographic evidence of malignancy. A result letter of this
screening mammogram will be mailed directly to the patient.

RECOMMENDATION:
Screening mammogram in one year. (Code:Q3-W-BC3)

BI-RADS CATEGORY  1: Negative.

## 2024-03-02 ENCOUNTER — Other Ambulatory Visit: Payer: PRIVATE HEALTH INSURANCE

## 2024-03-02 DIAGNOSIS — R7303 Prediabetes: Secondary | ICD-10-CM

## 2024-03-02 DIAGNOSIS — E538 Deficiency of other specified B group vitamins: Secondary | ICD-10-CM

## 2024-03-02 DIAGNOSIS — Z1329 Encounter for screening for other suspected endocrine disorder: Secondary | ICD-10-CM

## 2024-03-02 DIAGNOSIS — E782 Mixed hyperlipidemia: Secondary | ICD-10-CM

## 2024-03-02 DIAGNOSIS — I10 Essential (primary) hypertension: Secondary | ICD-10-CM

## 2024-03-02 DIAGNOSIS — E559 Vitamin D deficiency, unspecified: Secondary | ICD-10-CM

## 2024-03-03 ENCOUNTER — Ambulatory Visit: Payer: PRIVATE HEALTH INSURANCE | Admitting: Cardiology

## 2024-03-03 LAB — CMP14+EGFR
ALT: 16 IU/L (ref 0–32)
AST: 16 IU/L (ref 0–40)
Albumin: 4.2 g/dL (ref 3.9–4.9)
Alkaline Phosphatase: 66 IU/L (ref 44–121)
BUN/Creatinine Ratio: 13 (ref 12–28)
BUN: 10 mg/dL (ref 8–27)
Bilirubin Total: 0.6 mg/dL (ref 0.0–1.2)
CO2: 26 mmol/L (ref 20–29)
Calcium: 9.9 mg/dL (ref 8.7–10.3)
Chloride: 104 mmol/L (ref 96–106)
Creatinine, Ser: 0.8 mg/dL (ref 0.57–1.00)
Globulin, Total: 2.8 g/dL (ref 1.5–4.5)
Glucose: 92 mg/dL (ref 70–99)
Potassium: 4.4 mmol/L (ref 3.5–5.2)
Sodium: 141 mmol/L (ref 134–144)
Total Protein: 7 g/dL (ref 6.0–8.5)
eGFR: 84 mL/min/{1.73_m2} (ref >59)

## 2024-03-03 LAB — CBC WITH DIFFERENTIAL/PLATELET
Basophils Absolute: 0.1 10*3/uL (ref 0.0–0.2)
Basos: 1 %
EOS (ABSOLUTE): 0.2 10*3/uL (ref 0.0–0.4)
Eos: 5 %
Hematocrit: 41.5 % (ref 34.0–46.6)
Hemoglobin: 13.3 g/dL (ref 11.1–15.9)
Immature Grans (Abs): 0 10*3/uL (ref 0.0–0.1)
Immature Granulocytes: 1 %
Lymphocytes Absolute: 1.8 10*3/uL (ref 0.7–3.1)
Lymphs: 41 %
MCH: 29.5 pg (ref 26.6–33.0)
MCHC: 32 g/dL (ref 31.5–35.7)
MCV: 92 fL (ref 79–97)
Monocytes Absolute: 0.4 10*3/uL (ref 0.1–0.9)
Monocytes: 8 %
Neutrophils Absolute: 1.9 10*3/uL (ref 1.4–7.0)
Neutrophils: 44 %
Platelets: 262 10*3/uL (ref 150–450)
RBC: 4.51 x10E6/uL (ref 3.77–5.28)
RDW: 11.6 % — ABNORMAL LOW (ref 11.7–15.4)
WBC: 4.4 10*3/uL (ref 3.4–10.8)

## 2024-03-03 LAB — LIPID PANEL
Chol/HDL Ratio: 3.8 ratio (ref 0.0–4.4)
Cholesterol, Total: 211 mg/dL — ABNORMAL HIGH (ref 100–199)
HDL: 56 mg/dL (ref >39)
LDL Chol Calc (NIH): 147 mg/dL — ABNORMAL HIGH (ref 0–99)
Triglycerides: 45 mg/dL (ref 0–149)
VLDL Cholesterol Cal: 8 mg/dL (ref 5–40)

## 2024-03-03 LAB — TSH: TSH: 1.59 u[IU]/mL (ref 0.450–4.500)

## 2024-03-03 LAB — VITAMIN B12: Vitamin B-12: 788 pg/mL (ref 232–1245)

## 2024-03-03 LAB — VITAMIN D 25 HYDROXY (VIT D DEFICIENCY, FRACTURES): Vit D, 25-Hydroxy: 44.1 ng/mL (ref 30.0–100.0)

## 2024-03-03 LAB — HEMOGLOBIN A1C
Est. average glucose Bld gHb Est-mCnc: 91 mg/dL
Hgb A1c MFr Bld: 4.8 % (ref 4.8–5.6)

## 2024-03-06 ENCOUNTER — Encounter: Payer: Self-pay | Admitting: Cardiology

## 2024-03-06 ENCOUNTER — Ambulatory Visit: Payer: PRIVATE HEALTH INSURANCE | Admitting: Cardiology

## 2024-03-06 VITALS — BP 106/64 | HR 62 | Ht 67.0 in | Wt 153.0 lb

## 2024-03-06 DIAGNOSIS — E782 Mixed hyperlipidemia: Secondary | ICD-10-CM

## 2024-03-06 DIAGNOSIS — Z0130 Encounter for examination of blood pressure without abnormal findings: Secondary | ICD-10-CM

## 2024-03-06 MED ORDER — CITALOPRAM HYDROBROMIDE 40 MG PO TABS: 40.0000 mg | ORAL_TABLET | Freq: Every day | ORAL | 2 refills | Status: DC

## 2024-03-06 NOTE — Progress Notes (Signed)
 Established Patient Office Visit  Subjective:  Patient ID: Melanie Newton, female    DOB: [birth year]  Age: 63 y.o. MRN: [id number]  Chief Complaint  Patient presents with   Follow-up    6 Months Follow Up    Patient in office for 6 month follow up, discuss recent lab work. Patient doing well, no complaints today. Discussed recent lab work. LDL elevated, patient did not tolerate pravastatin. States she is taking fish oil and red yeast rice.  Up to date on maintenance exams.     No other concerns at this time.   Past Medical History:  Diagnosis Date   Allergic rhinitis    Anxiety    Asthma    COVID-19 virus infection 11/2020   Depression    Fibroids 2002   GERD (gastroesophageal reflux disease)    High cholesterol    Hyperlipidemia    Hypothyroidism    Overweight (BMI 25.0-29.9)    Overweight (BMI 25.0-29.9)    Prediabetes 03/26/2023   Vitamin D deficiency     Past Surgical History:  Procedure Laterality Date   ABDOMINAL HYSTERECTOMY     COLONOSCOPY     COLONOSCOPY WITH PROPOFOL N/A 01/11/2024   Procedure: COLONOSCOPY WITH PROPOFOL;  Surgeon: Regis Bill, MD;  Location: [facility];  Service: Endoscopy;  Laterality: N/A;   ECTOPIC PREGNANCY SURGERY  2000   Laparotomy   ROBOTIC ASSISTED TOTAL HYSTERECTOMY Right 10/25/2020   Procedure: XI ROBOTIC ASSISTED TOTAL HYSTERECTOMY and bilateral salpingectomy; Vaginal hysterectomy;  Surgeon: Hildred Laser, MD;  Location: [facility];  Service: Gynecology;  Laterality: Right;   UTERINE FIBROID SURGERY     X2    Social History   Socioeconomic History   Marital status: Married    Spouse name: Not on file   Number of children: Not on file   Years of education: Not on file   Highest education level: Not on file  Occupational History   Not on file  Tobacco Use   Smoking status: Some Days    Types: Cigarettes   Smokeless tobacco: Never  Vaping Use   Vaping status: Never Used  Substance and Sexual Activity    Alcohol use: Yes    Comment: occasional   Drug use: Yes    Types: Marijuana    Comment: RECENTLY   Sexual activity: Not Currently  Other Topics Concern   Not on file  Social History Narrative   Not on file   Social Drivers of Health   Financial Resource Strain: Not on file  Food Insecurity: Not on file  Transportation Needs: Not on file  Physical Activity: Not on file  Stress: Not on file  Social Connections: Not on file  Intimate Partner Violence: Not on file    Family History  Problem Relation Age of Onset   Breast cancer Cousin 35       maternal   Breast cancer Maternal Aunt 58   Lupus Other    Pancreatic cancer Other    Hypertension Other    Diabetes Other    Cancer Mother     Allergies  Allergen Reactions   Atorvastatin Other (See Comments)    Peripheral neuropathy    Outpatient Medications Prior to Visit  Medication Sig Note   Aspirin-Acetaminophen-Caffeine (GOODYS EXTRA STRENGTH PO) Take 2 packets by mouth as needed (headaches).    cholecalciferol (VITAMIN D3) 10 MCG (400 UNIT) TABS tablet Take 400 Units by mouth daily.    ibuprofen (ADVIL) 600 MG tablet Take 600  mg by mouth as needed for headache or moderate pain.    [DISCONTINUED] citalopram (CELEXA) 40 MG tablet Take 1 tablet (40 mg total) by mouth at bedtime.    [DISCONTINUED] pravastatin (PRAVACHOL) 20 MG tablet Take 1 tablet (20 mg total) by mouth every evening. (Patient not taking: Reported on 01/11/2024) 03/06/2024: muscle pain   No facility-administered medications prior to visit.    Review of Systems  Constitutional: Negative.   HENT: Negative.    Eyes: Negative.   Respiratory: Negative.  Negative for shortness of breath.   Cardiovascular: Negative.  Negative for chest pain.  Gastrointestinal: Negative.  Negative for abdominal pain, constipation and diarrhea.  Genitourinary: Negative.   Musculoskeletal:  Negative for joint pain and myalgias.  Skin: Negative.   Neurological: Negative.   Negative for dizziness and headaches.  Endo/Heme/Allergies: Negative.   All other systems reviewed and are negative.      Objective:   BP 106/64   Pulse 62   Ht 5\' 7"  (1.702 m)   Wt 153 lb (69.4 kg)   LMP  (LMP Unknown)   SpO2 96%   BMI 23.96 kg/m   Vitals:   03/06/24 1410  BP: 106/64  Pulse: 62  Height: 5\' 7"  (1.702 m)  Weight: 153 lb (69.4 kg)  SpO2: 96%  BMI (Calculated): 23.96    Physical Exam Vitals and nursing note reviewed.  Constitutional:      Appearance: Normal appearance. She is normal weight.  HENT:     Head: Normocephalic and atraumatic.     Nose: Nose normal.     Mouth/Throat:     Mouth: Mucous membranes are moist.     Pharynx: Oropharynx is clear.  Eyes:     Extraocular Movements: Extraocular movements intact.     Conjunctiva/sclera: Conjunctivae normal.     Pupils: Pupils are equal, round, and reactive to light.  Cardiovascular:     Rate and Rhythm: Normal rate and regular rhythm.     Pulses: Normal pulses.     Heart sounds: Normal heart sounds.  Pulmonary:     Effort: Pulmonary effort is normal.     Breath sounds: Normal breath sounds.  Abdominal:     General: Abdomen is flat. Bowel sounds are normal.     Palpations: Abdomen is soft.  Musculoskeletal:        General: Normal range of motion.     Cervical back: Normal range of motion and neck supple.     Right lower leg: No edema.     Left lower leg: No edema.  Skin:    General: Skin is warm and dry.  Neurological:     General: No focal deficit present.     Mental Status: She is alert and oriented to person, place, and time. Mental status is at baseline.  Psychiatric:        Mood and Affect: Mood normal.        Behavior: Behavior normal.        Thought Content: Thought content normal.        Judgment: Judgment normal.      No results found for any visits on 03/06/24.  Recent Results (from the past 2160 hours)  Hemoglobin A1c     Status: None   Collection Time: 03/02/24  9:53 AM   Result Value Ref Range   Hgb A1c MFr Bld 4.8 4.8 - 5.6 %    Comment:          Prediabetes: 5.7 - 6.4  Diabetes: >6.4          Glycemic control for adults with diabetes: <7.0    Est. average glucose Bld gHb Est-mCnc 91 mg/dL  TSH     Status: None   Collection Time: 03/02/24  9:53 AM  Result Value Ref Range   TSH 1.590 0.450 - 4.500 uIU/mL  CMP14+EGFR     Status: None   Collection Time: 03/02/24  9:53 AM  Result Value Ref Range   Glucose 92 70 - 99 mg/dL   BUN 10 8 - 27 mg/dL   Creatinine, Ser 1.30 0.57 - 1.00 mg/dL   eGFR 84 >86 VH/QIO/9.62   BUN/Creatinine Ratio 13 12 - 28   Sodium 141 134 - 144 mmol/L   Potassium 4.4 3.5 - 5.2 mmol/L   Chloride 104 96 - 106 mmol/L   CO2 26 20 - 29 mmol/L   Calcium 9.9 8.7 - 10.3 mg/dL   Total Protein 7.0 6.0 - 8.5 g/dL   Albumin 4.2 3.9 - 4.9 g/dL   Globulin, Total 2.8 1.5 - 4.5 g/dL   Bilirubin Total 0.6 0.0 - 1.2 mg/dL   Alkaline Phosphatase 66 44 - 121 IU/L   AST 16 0 - 40 IU/L   ALT 16 0 - 32 IU/L  Lipid panel     Status: Abnormal   Collection Time: 03/02/24  9:53 AM  Result Value Ref Range   Cholesterol, Total 211 (H) 100 - 199 mg/dL   Triglycerides 45 0 - 149 mg/dL   HDL 56 >95 mg/dL   VLDL Cholesterol Cal 8 5 - 40 mg/dL   LDL Chol Calc (NIH) 284 (H) 0 - 99 mg/dL   Chol/HDL Ratio 3.8 0.0 - 4.4 ratio    Comment:                                   T. Chol/HDL Ratio                                             Men  Women                               1/2 Avg.Risk  3.4    3.3                                   Avg.Risk  5.0    4.4                                2X Avg.Risk  9.6    7.1                                3X Avg.Risk 23.4   11.0   Vitamin D (25 hydroxy)     Status: None   Collection Time: 03/02/24  9:53 AM  Result Value Ref Range   Vit D, 25-Hydroxy 44.1 30.0 - 100.0 ng/mL    Comment: Vitamin D deficiency has been defined by the Institute of Medicine and an Endocrine Society practice guideline as a level of  serum  25-OH vitamin D less than 20 ng/mL (1,2). The Endocrine Society went on to further define vitamin D insufficiency as a level between 21 and 29 ng/mL (2). 1. IOM (Institute of Medicine). 2010. Dietary reference    intakes for calcium and D. [place]: The    [employer]. 2. Holick MF, Binkley Bejou, Bischoff-Ferrari HA, et al.    Evaluation, treatment, and prevention of vitamin D    deficiency: an Endocrine Society clinical practice    guideline. JCEM. 2011 Jul; 96(7):1911-30.   Vitamin B12     Status: None   Collection Time: 03/02/24  9:53 AM  Result Value Ref Range   Vitamin B-12 788 232 - 1,245 pg/mL  CBC with Diff     Status: Abnormal   Collection Time: 03/02/24  9:53 AM  Result Value Ref Range   WBC 4.4 3.4 - 10.8 x10E3/uL   RBC 4.51 3.77 - 5.28 x10E6/uL   Hemoglobin 13.3 11.1 - 15.9 g/dL   Hematocrit 16.1 09.6 - 46.6 %   MCV 92 79 - 97 fL   MCH 29.5 26.6 - 33.0 pg   MCHC 32.0 31.5 - 35.7 g/dL   RDW 04.5 (L) 40.9 - 81.1 %   Platelets 262 150 - 450 x10E3/uL   Neutrophils 44 Not Estab. %   Lymphs 41 Not Estab. %   Monocytes 8 Not Estab. %   Eos 5 Not Estab. %   Basos 1 Not Estab. %   Neutrophils Absolute 1.9 1.4 - 7.0 x10E3/uL   Lymphocytes Absolute 1.8 0.7 - 3.1 x10E3/uL   Monocytes Absolute 0.4 0.1 - 0.9 x10E3/uL   EOS (ABSOLUTE) 0.2 0.0 - 0.4 x10E3/uL   Basophils Absolute 0.1 0.0 - 0.2 x10E3/uL   Immature Granulocytes 1 Not Estab. %   Immature Grans (Abs) 0.0 0.0 - 0.1 x10E3/uL      Assessment & Plan:  Continue same medications.     Problem List Items Addressed This Visit       Other   Mixed hyperlipidemia - Primary    Return in about 6 months (around 09/05/2024) for with fasting labs prior.   Total time spent: 25 minutes  [employer], NP  03/06/2024   This document may have been prepared by Dragon Voice Recognition software and as such may include unintentional dictation errors.

## 2024-04-03 ENCOUNTER — Telehealth: Payer: Self-pay | Admitting: Internal Medicine

## 2024-04-03 MED ORDER — ALBUTEROL SULFATE HFA 108 (90 BASE) MCG/ACT IN AERS: 2.0000 | INHALATION_SPRAY | Freq: Four times a day (QID) | RESPIRATORY_TRACT | 2 refills | Status: AC | PRN

## 2024-04-03 NOTE — Telephone Encounter (Signed)
 Patient left VM wanting a prescription for an "asthma pump". I think she is referring to an inhaler. States she has one but it is old and she would like a new one.

## 2024-04-11 ENCOUNTER — Ambulatory Visit: Payer: PRIVATE HEALTH INSURANCE | Admitting: Cardiology

## 2024-04-11 ENCOUNTER — Encounter: Payer: Self-pay | Admitting: Cardiology

## 2024-04-11 ENCOUNTER — Inpatient Hospital Stay: Admission: RE | Admit: 2024-04-11 | Source: Ambulatory Visit

## 2024-04-11 VITALS — BP 143/61 | HR 70 | Ht 65.0 in | Wt 151.2 lb

## 2024-04-11 DIAGNOSIS — Z0130 Encounter for examination of blood pressure without abnormal findings: Secondary | ICD-10-CM

## 2024-04-11 DIAGNOSIS — R051 Acute cough: Secondary | ICD-10-CM | POA: Diagnosis not present

## 2024-04-11 DIAGNOSIS — J069 Acute upper respiratory infection, unspecified: Secondary | ICD-10-CM

## 2024-04-11 MED ORDER — AMOXICILLIN-POT CLAVULANATE 875-125 MG PO TABS: 1.0000 | ORAL_TABLET | Freq: Two times a day (BID) | ORAL | 0 refills | Status: DC

## 2024-04-11 MED ORDER — METHYLPREDNISOLONE 4 MG PO TBPK: ORAL_TABLET | ORAL | 0 refills | Status: DC

## 2024-04-11 MED ORDER — BENZONATATE 100 MG PO CAPS: 100.0000 mg | ORAL_CAPSULE | Freq: Three times a day (TID) | ORAL | 1 refills | Status: DC | PRN

## 2024-04-11 NOTE — Progress Notes (Signed)
 Established Patient Office Visit  Subjective:  Patient ID: Melanie Newton, female    DOB: 06/08/1961  Age: 63 y.o. MRN: [id number]  Chief Complaint  Patient presents with   Acute Visit    Wheezing, mucus with blood, cough     Patient in office for an acute visit, complaining of wheezing, cough, hemoptysis. Patient reports symptoms started about 2 weeks ago. Hemoptysis started last night. Patient complaining of headaches, nasal congestion, PND, rhinorrhea, sore throat and wheezing. Patient has a history of asthma. No wheezing noted on exam.  Will send in Augmentin, Tessalon pearls, medrol dose pack. Recommend continuing Mucinex , increase water intake, and rest. Use inhaler as prescribed.   Cough This is a new problem. The current episode started 1 to 4 weeks ago. The problem has been gradually worsening. The cough is Productive of sputum, productive of purulent sputum and productive of bloody sputum. Associated symptoms include headaches, hemoptysis, nasal congestion, postnasal drip, rhinorrhea, a sore throat and wheezing. Pertinent negatives include no chest pain, ear congestion, ear pain, myalgias or shortness of breath. The symptoms are aggravated by lying down. She has tried rest (Mucinex , Alka seltzer) for the symptoms. The treatment provided no relief. Her past medical history is significant for asthma.    No other concerns at this time.   Past Medical History:  Diagnosis Date   Allergic rhinitis    Anxiety    Asthma    COVID-19 virus infection 11/2020   Depression    Fibroids 2002   GERD (gastroesophageal reflux disease)    High cholesterol    Hyperlipidemia    Hypothyroidism    Overweight (BMI 25.0-29.9)    Overweight (BMI 25.0-29.9)    Prediabetes 03/26/2023   Vitamin D  deficiency     Past Surgical History:  Procedure Laterality Date   ABDOMINAL HYSTERECTOMY     COLONOSCOPY     COLONOSCOPY WITH PROPOFOL  N/A 01/11/2024   Procedure: COLONOSCOPY WITH PROPOFOL ;   Surgeon: Shane Darling, MD;  Location: [facility];  Service: Endoscopy;  Laterality: N/A;   ECTOPIC PREGNANCY SURGERY  2000   Laparotomy   ROBOTIC ASSISTED TOTAL HYSTERECTOMY Right 10/25/2020   Procedure: XI ROBOTIC ASSISTED TOTAL HYSTERECTOMY and bilateral salpingectomy; Vaginal hysterectomy;  Surgeon: Teresa Fender, MD;  Location: [facility];  Service: Gynecology;  Laterality: Right;   UTERINE FIBROID SURGERY     X2    Social History   Socioeconomic History   Marital status: Married    Spouse name: Not on file   Number of children: Not on file   Years of education: Not on file   Highest education level: Not on file  Occupational History   Not on file  Tobacco Use   Smoking status: Some Days    Types: Cigarettes   Smokeless tobacco: Never  Vaping Use   Vaping status: Never Used  Substance and Sexual Activity   Alcohol use: Yes    Comment: occasional   Drug use: Yes    Types: Marijuana    Comment: RECENTLY   Sexual activity: Not Currently  Other Topics Concern   Not on file  Social History Narrative   Not on file   Social Drivers of Health   Financial Resource Strain: Not on file  Food Insecurity: Not on file  Transportation Needs: Not on file  Physical Activity: Not on file  Stress: Not on file  Social Connections: Not on file  Intimate Partner Violence: Not on file    Family History  Problem  Relation Age of Onset   Breast cancer Cousin 58       maternal   Breast cancer Maternal Aunt 58   Lupus Other    Pancreatic cancer Other    Hypertension Other    Diabetes Other    Cancer Mother     Allergies  Allergen Reactions   Atorvastatin Other (See Comments)    Peripheral neuropathy    Outpatient Medications Prior to Visit  Medication Sig   albuterol  (VENTOLIN  HFA) 108 (90 Base) MCG/ACT inhaler Inhale 2 puffs into the lungs every 6 (six) hours as needed for wheezing or shortness of breath.   cholecalciferol (VITAMIN D3) 10 MCG (400 UNIT) TABS  tablet Take 400 Units by mouth daily.   citalopram  (CELEXA ) 40 MG tablet Take 1 tablet (40 mg total) by mouth at bedtime.   Aspirin-Acetaminophen -Caffeine (GOODYS EXTRA STRENGTH PO) Take 2 packets by mouth as needed (headaches). (Patient not taking: Reported on 04/11/2024)   ibuprofen  (ADVIL ) 600 MG tablet Take 600 mg by mouth as needed for headache or moderate pain. (Patient not taking: Reported on 04/11/2024)   No facility-administered medications prior to visit.    Review of Systems  Constitutional: Negative.   HENT:  Positive for postnasal drip, rhinorrhea and sore throat. Negative for ear pain.   Eyes: Negative.   Respiratory:  Positive for cough, hemoptysis, sputum production and wheezing. Negative for shortness of breath.   Cardiovascular: Negative.  Negative for chest pain.  Gastrointestinal: Negative.  Negative for abdominal pain, constipation and diarrhea.  Genitourinary: Negative.   Musculoskeletal:  Negative for joint pain and myalgias.  Skin: Negative.   Neurological:  Positive for headaches. Negative for dizziness.  Endo/Heme/Allergies: Negative.   All other systems reviewed and are negative.      Objective:   BP (!) 143/61   Pulse 70   Ht 5\' 5"  (1.651 m)   Wt 151 lb 3.2 oz (68.6 kg)   LMP  (LMP Unknown)   SpO2 (!) 70%   BMI 25.16 kg/m   Vitals:   04/11/24 1445  BP: (!) 143/61  Pulse: 70  Height: 5\' 5"  (1.651 m)  Weight: 151 lb 3.2 oz (68.6 kg)  SpO2: (!) 70%  BMI (Calculated): 25.16    Physical Exam Vitals and nursing note reviewed.  Constitutional:      Appearance: Normal appearance. She is normal weight.  HENT:     Head: Normocephalic and atraumatic.     Nose: Nose normal.     Mouth/Throat:     Mouth: Mucous membranes are moist.  Eyes:     Extraocular Movements: Extraocular movements intact.     Conjunctiva/sclera: Conjunctivae normal.     Pupils: Pupils are equal, round, and reactive to light.  Cardiovascular:     Rate and Rhythm: Normal  rate and regular rhythm.     Pulses: Normal pulses.     Heart sounds: Normal heart sounds.  Pulmonary:     Effort: No respiratory distress.     Breath sounds: Normal breath sounds. No stridor. No wheezing, rhonchi or rales.  Abdominal:     General: Abdomen is flat. Bowel sounds are normal.     Palpations: Abdomen is soft.  Musculoskeletal:        General: Normal range of motion.     Cervical back: Normal range of motion.  Skin:    General: Skin is warm and dry.  Neurological:     General: No focal deficit present.     Mental Status:  She is alert and oriented to person, place, and time.  Psychiatric:        Mood and Affect: Mood normal.        Behavior: Behavior normal.        Thought Content: Thought content normal.        Judgment: Judgment normal.      No results found for any visits on 04/11/24.  Recent Results (from the past 2160 hours)  Hemoglobin A1c     Status: None   Collection Time: 03/02/24  9:53 AM  Result Value Ref Range   Hgb A1c MFr Bld 4.8 4.8 - 5.6 %    Comment:          Prediabetes: 5.7 - 6.4          Diabetes: >6.4          Glycemic control for adults with diabetes: <7.0    Est. average glucose Bld gHb Est-mCnc 91 mg/dL  TSH     Status: None   Collection Time: 03/02/24  9:53 AM  Result Value Ref Range   TSH 1.590 0.450 - 4.500 uIU/mL  CMP14+EGFR     Status: None   Collection Time: 03/02/24  9:53 AM  Result Value Ref Range   Glucose 92 70 - 99 mg/dL   BUN 10 8 - 27 mg/dL   Creatinine, Ser 9.81 0.57 - 1.00 mg/dL   eGFR 84 >19 JY/NWG/9.56   BUN/Creatinine Ratio 13 12 - 28   Sodium 141 134 - 144 mmol/L   Potassium 4.4 3.5 - 5.2 mmol/L   Chloride 104 96 - 106 mmol/L   CO2 26 20 - 29 mmol/L   Calcium  9.9 8.7 - 10.3 mg/dL   Total Protein 7.0 6.0 - 8.5 g/dL   Albumin 4.2 3.9 - 4.9 g/dL   Globulin, Total 2.8 1.5 - 4.5 g/dL   Bilirubin Total 0.6 0.0 - 1.2 mg/dL   Alkaline Phosphatase 66 44 - 121 IU/L   AST 16 0 - 40 IU/L   ALT 16 0 - 32 IU/L   Lipid panel     Status: Abnormal   Collection Time: 03/02/24  9:53 AM  Result Value Ref Range   Cholesterol, Total 211 (H) 100 - 199 mg/dL   Triglycerides 45 0 - 149 mg/dL   HDL 56 >21 mg/dL   VLDL Cholesterol Cal 8 5 - 40 mg/dL   LDL Chol Calc (NIH) 308 (H) 0 - 99 mg/dL   Chol/HDL Ratio 3.8 0.0 - 4.4 ratio    Comment:                                   T. Chol/HDL Ratio                                             Men  Women                               1/2 Avg.Risk  3.4    3.3                                   Avg.Risk  5.0    4.4  2X Avg.Risk  9.6    7.1                                3X Avg.Risk 23.4   11.0   Vitamin D  (25 hydroxy)     Status: None   Collection Time: 03/02/24  9:53 AM  Result Value Ref Range   Vit D, 25-Hydroxy 44.1 30.0 - 100.0 ng/mL    Comment: Vitamin D  deficiency has been defined by the Institute of Medicine and an Endocrine Society practice guideline as a level of serum 25-OH vitamin D  less than 20 ng/mL (1,2). The Endocrine Society went on to further define vitamin D  insufficiency as a level between 21 and 29 ng/mL (2). 1. IOM (Institute of Medicine). 2010. Dietary reference    intakes for calcium  and D. [place]: The    [employer]. 2. Holick MF, Binkley [place], Bischoff-Ferrari HA, et al.    Evaluation, treatment, and prevention of vitamin D     deficiency: an Endocrine Society clinical practice    guideline. JCEM. 2011 Jul; 96(7):1911-30.   Vitamin B12     Status: None   Collection Time: 03/02/24  9:53 AM  Result Value Ref Range   Vitamin B-12 788 232 - 1,245 pg/mL  CBC with Diff     Status: Abnormal   Collection Time: 03/02/24  9:53 AM  Result Value Ref Range   WBC 4.4 3.4 - 10.8 x10E3/uL   RBC 4.51 3.77 - 5.28 x10E6/uL   Hemoglobin 13.3 11.1 - 15.9 g/dL   Hematocrit 62.9 52.8 - 46.6 %   MCV 92 79 - 97 fL   MCH 29.5 26.6 - 33.0 pg   MCHC 32.0 31.5 - 35.7 g/dL   RDW 41.3 (L) 24.4 - 01.0 %    Platelets 262 150 - 450 x10E3/uL   Neutrophils 44 Not Estab. %   Lymphs 41 Not Estab. %   Monocytes 8 Not Estab. %   Eos 5 Not Estab. %   Basos 1 Not Estab. %   Neutrophils Absolute 1.9 1.4 - 7.0 x10E3/uL   Lymphocytes Absolute 1.8 0.7 - 3.1 x10E3/uL   Monocytes Absolute 0.4 0.1 - 0.9 x10E3/uL   EOS (ABSOLUTE) 0.2 0.0 - 0.4 x10E3/uL   Basophils Absolute 0.1 0.0 - 0.2 x10E3/uL   Immature Granulocytes 1 Not Estab. %   Immature Grans (Abs) 0.0 0.0 - 0.1 x10E3/uL      Assessment & Plan:  Augmentin Tessalon pearls Medrol dose pack Increase water intake Mucinex  Rest Inhaler as prescribed  Problem List Items Addressed This Visit       Respiratory   Acute URI - Primary    Return if symptoms worsen or fail to improve.   Total time spent: 25 minutes  [employer], NP  04/11/2024   This document may have been prepared by Dragon Voice Recognition software and as such may include unintentional dictation errors.

## 2024-04-12 DIAGNOSIS — J069 Acute upper respiratory infection, unspecified: Secondary | ICD-10-CM | POA: Insufficient documentation

## 2024-05-08 ENCOUNTER — Telehealth: Payer: Self-pay | Admitting: Cardiology

## 2024-05-08 NOTE — Telephone Encounter (Signed)
 Patient came in asking if she could switch her pcp from [facility] to Dr. Donley Furth? Please adv

## 2024-05-10 ENCOUNTER — Other Ambulatory Visit (HOSPITAL_COMMUNITY): Payer: Self-pay | Admitting: Neurosurgery

## 2024-05-10 DIAGNOSIS — I671 Cerebral aneurysm, nonruptured: Secondary | ICD-10-CM

## 2024-05-19 ENCOUNTER — Ambulatory Visit (HOSPITAL_COMMUNITY): Payer: PRIVATE HEALTH INSURANCE

## 2024-05-19 ENCOUNTER — Encounter (HOSPITAL_COMMUNITY): Payer: Self-pay

## 2024-06-12 ENCOUNTER — Other Ambulatory Visit: Payer: Self-pay | Admitting: Internal Medicine

## 2024-06-12 ENCOUNTER — Telehealth: Payer: Self-pay | Admitting: Internal Medicine

## 2024-06-12 DIAGNOSIS — Z1231 Encounter for screening mammogram for malignant neoplasm of breast: Secondary | ICD-10-CM

## 2024-06-12 NOTE — Telephone Encounter (Signed)
 Patient called stating that she received her letter stating that she was due for her mammo and asked if we could send the order please. She has switched her pcp to [facility],but hasn't seen him yet. She is scheduled to see him in Sept. Please advise.

## 2024-07-04 ENCOUNTER — Ambulatory Visit
Admission: RE | Admit: 2024-07-04 | Discharge: 2024-07-04 | Disposition: A | Discharge status: Home / Self Care | Payer: PRIVATE HEALTH INSURANCE | Source: Ambulatory Visit | Attending: Internal Medicine | Admitting: Internal Medicine

## 2024-07-04 DIAGNOSIS — Z1231 Encounter for screening mammogram for malignant neoplasm of breast: Secondary | ICD-10-CM | POA: Insufficient documentation

## 2024-08-04 ENCOUNTER — Other Ambulatory Visit: Payer: PRIVATE HEALTH INSURANCE

## 2024-08-04 DIAGNOSIS — I10 Essential (primary) hypertension: Secondary | ICD-10-CM

## 2024-08-04 DIAGNOSIS — R7303 Prediabetes: Secondary | ICD-10-CM

## 2024-08-04 DIAGNOSIS — E782 Mixed hyperlipidemia: Secondary | ICD-10-CM

## 2024-08-04 DIAGNOSIS — E538 Deficiency of other specified B group vitamins: Secondary | ICD-10-CM

## 2024-08-04 DIAGNOSIS — Z1329 Encounter for screening for other suspected endocrine disorder: Secondary | ICD-10-CM

## 2024-08-04 DIAGNOSIS — E559 Vitamin D deficiency, unspecified: Secondary | ICD-10-CM

## 2024-08-05 LAB — CBC WITH DIFFERENTIAL/PLATELET
Basophils Absolute: 0.1 x10E3/uL (ref 0.0–0.2)
Basos: 1 %
EOS (ABSOLUTE): 0.2 x10E3/uL (ref 0.0–0.4)
Eos: 5 %
Hematocrit: 41.1 % (ref 34.0–46.6)
Hemoglobin: 13.1 g/dL (ref 11.1–15.9)
Immature Grans (Abs): 0 x10E3/uL (ref 0.0–0.1)
Immature Granulocytes: 0 %
Lymphocytes Absolute: 1.7 x10E3/uL (ref 0.7–3.1)
Lymphs: 36 %
MCH: 29.4 pg (ref 26.6–33.0)
MCHC: 31.9 g/dL (ref 31.5–35.7)
MCV: 92 fL (ref 79–97)
Monocytes Absolute: 0.4 x10E3/uL (ref 0.1–0.9)
Monocytes: 8 %
Neutrophils Absolute: 2.4 x10E3/uL (ref 1.4–7.0)
Neutrophils: 50 %
Platelets: 230 x10E3/uL (ref 150–450)
RBC: 4.45 x10E6/uL (ref 3.77–5.28)
RDW: 11.2 % — ABNORMAL LOW (ref 11.7–15.4)
WBC: 4.7 x10E3/uL (ref 3.4–10.8)

## 2024-08-05 LAB — CMP14+EGFR
ALT: 9 IU/L (ref 0–32)
AST: 13 IU/L (ref 0–40)
Albumin: 4.2 g/dL (ref 3.9–4.9)
Alkaline Phosphatase: 60 IU/L (ref 44–121)
BUN/Creatinine Ratio: 12 (ref 12–28)
BUN: 10 mg/dL (ref 8–27)
Bilirubin Total: 0.7 mg/dL (ref 0.0–1.2)
CO2: 24 mmol/L (ref 20–29)
Calcium: 9.8 mg/dL (ref 8.7–10.3)
Chloride: 104 mmol/L (ref 96–106)
Creatinine, Ser: 0.84 mg/dL (ref 0.57–1.00)
Globulin, Total: 2.6 g/dL (ref 1.5–4.5)
Glucose: 87 mg/dL (ref 70–99)
Potassium: 4.9 mmol/L (ref 3.5–5.2)
Sodium: 140 mmol/L (ref 134–144)
Total Protein: 6.8 g/dL (ref 6.0–8.5)
eGFR: 79 mL/min/1.73 (ref >59)

## 2024-08-05 LAB — VITAMIN D 25 HYDROXY (VIT D DEFICIENCY, FRACTURES): Vit D, 25-Hydroxy: 44.9 ng/mL (ref 30.0–100.0)

## 2024-08-05 LAB — LIPID PANEL
Chol/HDL Ratio: 4.1 ratio (ref 0.0–4.4)
Cholesterol, Total: 231 mg/dL — ABNORMAL HIGH (ref 100–199)
HDL: 57 mg/dL (ref >39)
LDL Chol Calc (NIH): 163 mg/dL — ABNORMAL HIGH (ref 0–99)
Triglycerides: 65 mg/dL (ref 0–149)
VLDL Cholesterol Cal: 11 mg/dL (ref 5–40)

## 2024-08-05 LAB — HEMOGLOBIN A1C
Est. average glucose Bld gHb Est-mCnc: 91 mg/dL
Hgb A1c MFr Bld: 4.8 % (ref 4.8–5.6)

## 2024-08-05 LAB — TSH: TSH: 1.2 u[IU]/mL (ref 0.450–4.500)

## 2024-08-05 LAB — VITAMIN B12: Vitamin B-12: 702 pg/mL (ref 232–1245)

## 2024-08-06 ENCOUNTER — Emergency Department (HOSPITAL_COMMUNITY)
Admission: EM | Admit: 2024-08-06 | Discharge: 2024-08-06 | Disposition: A | Discharge status: Home / Self Care | Payer: PRIVATE HEALTH INSURANCE | Attending: Emergency Medicine | Admitting: Emergency Medicine

## 2024-08-06 ENCOUNTER — Other Ambulatory Visit: Payer: Self-pay

## 2024-08-06 ENCOUNTER — Encounter (HOSPITAL_COMMUNITY): Payer: Self-pay

## 2024-08-06 DIAGNOSIS — J45909 Unspecified asthma, uncomplicated: Secondary | ICD-10-CM | POA: Insufficient documentation

## 2024-08-06 DIAGNOSIS — E039 Hypothyroidism, unspecified: Secondary | ICD-10-CM | POA: Insufficient documentation

## 2024-08-06 DIAGNOSIS — R112 Nausea with vomiting, unspecified: Secondary | ICD-10-CM | POA: Diagnosis present

## 2024-08-06 DIAGNOSIS — U071 COVID-19: Secondary | ICD-10-CM | POA: Insufficient documentation

## 2024-08-06 LAB — CBC
HCT: 40.6 % (ref 36.0–46.0)
Hemoglobin: 13.4 g/dL (ref 12.0–15.0)
MCH: 29.3 pg (ref 26.0–34.0)
MCHC: 33 g/dL (ref 30.0–36.0)
MCV: 88.6 fL (ref 80.0–100.0)
Platelets: 202 K/uL (ref 150–400)
RBC: 4.58 MIL/uL (ref 3.87–5.11)
RDW: 11.6 % (ref 11.5–15.5)
WBC: 6.3 K/uL (ref 4.0–10.5)
nRBC: 0 % (ref 0.0–0.2)

## 2024-08-06 LAB — COMPREHENSIVE METABOLIC PANEL WITH GFR
ALT: 15 U/L (ref 0–44)
AST: 20 U/L (ref 15–41)
Albumin: 3.8 g/dL (ref 3.5–5.0)
Alkaline Phosphatase: 49 U/L (ref 38–126)
Anion gap: 10 (ref 5–15)
BUN: 7 mg/dL — ABNORMAL LOW (ref 8–23)
CO2: 23 mmol/L (ref 22–32)
Calcium: 9.4 mg/dL (ref 8.9–10.3)
Chloride: 102 mmol/L (ref 98–111)
Creatinine, Ser: 0.84 mg/dL (ref 0.44–1.00)
GFR, Estimated: >60 mL/min (ref >60)
Glucose, Bld: 107 mg/dL — ABNORMAL HIGH (ref 70–99)
Potassium: 3.8 mmol/L (ref 3.5–5.1)
Sodium: 135 mmol/L (ref 135–145)
Total Bilirubin: 0.9 mg/dL (ref 0.0–1.2)
Total Protein: 7.5 g/dL (ref 6.5–8.1)

## 2024-08-06 LAB — URINALYSIS, ROUTINE W REFLEX MICROSCOPIC
Bilirubin Urine: NEGATIVE
Glucose, UA: NEGATIVE mg/dL
Hgb urine dipstick: NEGATIVE
Ketones, ur: 20 mg/dL — AB
Leukocytes,Ua: NEGATIVE
Nitrite: NEGATIVE
Protein, ur: NEGATIVE mg/dL
Specific Gravity, Urine: 1.015 (ref 1.005–1.030)
pH: 6 (ref 5.0–8.0)

## 2024-08-06 LAB — LIPASE, BLOOD: Lipase: 22 U/L (ref 11–51)

## 2024-08-06 LAB — RESP PANEL BY RT-PCR (RSV, FLU A&B, COVID)  RVPGX2
Influenza A by PCR: NEGATIVE
Influenza B by PCR: NEGATIVE
Resp Syncytial Virus by PCR: NEGATIVE
SARS Coronavirus 2 by RT PCR: POSITIVE — AB

## 2024-08-06 MED ORDER — LACTATED RINGERS IV BOLUS
1000.0000 mL | Freq: Once | INTRAVENOUS | Status: AC
  Administered 2024-08-06: 1000 mL via INTRAVENOUS

## 2024-08-06 MED ORDER — ONDANSETRON HCL 4 MG PO TABS: 4.0000 mg | ORAL_TABLET | Freq: Four times a day (QID) | ORAL | 0 refills | Status: AC

## 2024-08-06 MED ORDER — ACETAMINOPHEN 325 MG PO TABS
650.0000 mg | ORAL_TABLET | Freq: Once | ORAL | Status: AC
  Administered 2024-08-06: 650 mg via ORAL
  Filled 2024-08-06: qty 2

## 2024-08-06 MED ORDER — ONDANSETRON 4 MG PO TBDP
4.0000 mg | ORAL_TABLET | Freq: Once | ORAL | Status: AC | PRN
  Administered 2024-08-06: 4 mg via ORAL
  Filled 2024-08-06: qty 1

## 2024-08-06 MED ORDER — PROCHLORPERAZINE EDISYLATE 10 MG/2ML IJ SOLN
10.0000 mg | Freq: Once | INTRAMUSCULAR | Status: AC
  Administered 2024-08-06: 10 mg via INTRAVENOUS
  Filled 2024-08-06: qty 2

## 2024-08-06 NOTE — ED Provider Notes (Signed)
 [place] EMERGENCY DEPARTMENT AT [facility] Provider Note HPI Melanie Newton is a 63 y.o. female with a medical history as below who presents to the emergency department with 2 days of cough, headaches, congestion, body aches, decreased p.o. intake, nausea and vomiting.  She is also been having subjective fevers at home.  Denies shortness of breath/chest pain  Past Medical History:  Diagnosis Date   Allergic rhinitis    Anxiety    Asthma    COVID-19 virus infection 11/2020   Depression    Fibroids 2002   GERD (gastroesophageal reflux disease)    High cholesterol    Hyperlipidemia    Hypothyroidism    Overweight (BMI 25.0-29.9)    Overweight (BMI 25.0-29.9)    Prediabetes 03/26/2023   Vitamin D  deficiency    Past Surgical History:  Procedure Laterality Date   ABDOMINAL HYSTERECTOMY     COLONOSCOPY     COLONOSCOPY WITH PROPOFOL  N/A 01/11/2024   Procedure: COLONOSCOPY WITH PROPOFOL ;  Surgeon: Maryruth Ole DASEN, MD;  Location: [facility] ENDOSCOPY;  Service: Endoscopy;  Laterality: N/A;   ECTOPIC PREGNANCY SURGERY  2000   Laparotomy   ROBOTIC ASSISTED TOTAL HYSTERECTOMY Right 10/25/2020   Procedure: XI ROBOTIC ASSISTED TOTAL HYSTERECTOMY and bilateral salpingectomy; Vaginal hysterectomy;  Surgeon: Connell Davies, MD;  Location: [facility] ORS;  Service: Gynecology;  Laterality: Right;   UTERINE FIBROID SURGERY     X2    Review of Systems Pertinent positives and negative findings are listed as part of the History of Present Illness and MDM  Physical Exam Vitals:   08/06/24 1256 08/06/24 1757 08/06/24 1926 08/06/24 1930  BP: (!) 142/103 126/81 133/79 (!) 145/89  Pulse: 89 67 66 70  Resp: 18 16 11 13   Temp: 99.6 F (37.6 C) 99.9 F (37.7 C) 99.8 F (37.7 C)   TempSrc:   Oral   SpO2: 98% 99% 100% 100%     Constitutional Nursing notes reviewed Vital signs reviewed  HEENT No obvious trauma Pupils round, equal, and reactive to light. Pupils cross midline Uvula  midline with no exudates Neck supple  Respiratory Effort normal Breathing well on room air CTAB  CV Normal rate and rhythm   Abdomen Soft, non-tender, non-distended No peritonitis  MSK Atraumatic No obvious deformity ROM appropriate  Neuro Awake and alert Pupils cross midline Moving all extremities    MDM:  Initial Differential Diagnoses includes viral URI, pneumonia, pneumothorax, electrolyte abnormality, anemia, migraine, headache, pancreatitis  I reviewed the patient's vitals, the nursing triage note and evaluated the patient at bedside.  Patient is hemodynamically stable and well-appearing on exam.  She is breathing well on room air with no evidence of tachypnea or hypoxia.  No tachycardia.  Labs interpreted by myself show no leukocytosis, anemia, AKI, acute liver injury or significant electrolyte abnormality.  Lipase within normal limits, lowering my suspicion for pancreatitis.  Lungs clear to auscultation bilaterally with no focal findings, lowering my suspicion for pneumonia.  Patient is COVID-positive which would explain her symptoms.  She was given a bolus of fluids, Zofran  and Compazine  for her headache.  On reevaluation, she reports resolution of her headache and nausea.   Patient discharged home with Zofran   Procedures: Procedures  Medications administered in the ED: Medications  ondansetron  (ZOFRAN -ODT) disintegrating tablet 4 mg (4 mg Oral Given 08/06/24 1306)  acetaminophen  (TYLENOL ) tablet 650 mg (650 mg Oral Given 08/06/24 1309)  prochlorperazine  (COMPAZINE ) injection 10 mg (10 mg Intravenous Given 08/06/24 1925)  lactated ringers  bolus 1,000  mL (0 mLs Intravenous Stopped 08/06/24 2042)     Impression: 1. Nausea and vomiting, unspecified vomiting type   2. COVID-19      Patient's presentation is most consistent with acute presentation with potential threat to life or bodily function.  Disposition: ED Disposition:  Discharge   Discharge: Patient is felt to be  medically appropriate for discharge at this time. Patient was instructed to follow up with their primary care doctor/specialists listed above for re-evaluation. Patient was given strict return precautions.  ED Discharge Orders          Ordered    ondansetron  (ZOFRAN ) 4 MG tablet  Every 6 hours        08/06/24 1901                  Dionisio Blunt, MD 08/06/24 2358    Bernard Drivers, MD 08/09/24 1728

## 2024-08-06 NOTE — ED Triage Notes (Signed)
 Pt c.o pain all over. +chills and vomiting. Pt was recently at a funeral around a bunch of people.

## 2024-08-06 NOTE — Discharge Instructions (Signed)
 You were seen in the emergency department for nausea, vomiting, bodyaches and headache.  You were found to have COVID.  We gave you fluids, nausea control, and medication for your headache in the emergency department.  Your labs were all reassuring and showed no significant abnormalities.  Please take Tylenol  and/or ibuprofen  at home unless you are unable to take one of these medications.  Stay well-hydrated.  Take Zofran  as needed for nausea/vomiting.  Please come back to the emergency department if you are unable to tolerate food, have severe vomiting, have weakness, or if you have any other reason to believe you need emergency care

## 2024-08-06 NOTE — ED Triage Notes (Signed)
 Pt endorses n/v and dizziness since last night. Endorses full body pain.

## 2024-08-07 ENCOUNTER — Ambulatory Visit: Payer: Self-pay | Admitting: Cardiology

## 2024-08-08 ENCOUNTER — Ambulatory Visit: Payer: PRIVATE HEALTH INSURANCE | Admitting: Internal Medicine

## 2024-08-08 NOTE — Progress Notes (Signed)
Pt informed

## 2024-08-29 ENCOUNTER — Ambulatory Visit: Payer: PRIVATE HEALTH INSURANCE | Admitting: Internal Medicine

## 2024-08-29 VITALS — BP 122/66 | HR 51 | Temp 97.8°F | Ht 65.0 in | Wt 156.8 lb

## 2024-08-29 DIAGNOSIS — R7303 Prediabetes: Secondary | ICD-10-CM

## 2024-08-29 DIAGNOSIS — E782 Mixed hyperlipidemia: Secondary | ICD-10-CM

## 2024-08-29 DIAGNOSIS — I10 Essential (primary) hypertension: Secondary | ICD-10-CM

## 2024-08-29 MED ORDER — NEXLETOL 180 MG PO TABS: 1.0000 | ORAL_TABLET | Freq: Every day | ORAL | 0 refills | Status: AC

## 2024-08-29 NOTE — Progress Notes (Signed)
 Established Patient Office Visit  Subjective:  Patient ID: Melanie Newton, female    DOB: 03/28/1961  Age: 63 y.o. MRN: [id number]  Chief Complaint  Patient presents with   Establish Care    Establish care, previous amber pt.    No new complaints, here for lab review and medication refills.     No other concerns at this time.   Past Medical History:  Diagnosis Date   Allergic rhinitis    Anxiety    Asthma    COVID-19 virus infection 11/2020   Depression    Fibroids 2002   GERD (gastroesophageal reflux disease)    High cholesterol    Hyperlipidemia    Hypothyroidism    Overweight (BMI 25.0-29.9)    Overweight (BMI 25.0-29.9)    Prediabetes 03/26/2023   Vitamin D  deficiency     Past Surgical History:  Procedure Laterality Date   ABDOMINAL HYSTERECTOMY     COLONOSCOPY     COLONOSCOPY WITH PROPOFOL  N/A 01/11/2024   Procedure: COLONOSCOPY WITH PROPOFOL ;  Surgeon: Maryruth Ole DASEN, MD;  Location: [facility] ENDOSCOPY;  Service: Endoscopy;  Laterality: N/A;   ECTOPIC PREGNANCY SURGERY  2000   Laparotomy   ROBOTIC ASSISTED TOTAL HYSTERECTOMY Right 10/25/2020   Procedure: XI ROBOTIC ASSISTED TOTAL HYSTERECTOMY and bilateral salpingectomy; Vaginal hysterectomy;  Surgeon: Connell Davies, MD;  Location: [facility] ORS;  Service: Gynecology;  Laterality: Right;   UTERINE FIBROID SURGERY     X2    Social History   Socioeconomic History   Marital status: Married    Spouse name: Not on file   Number of children: Not on file   Years of education: Not on file   Highest education level: Not on file  Occupational History   Not on file  Tobacco Use   Smoking status: Some Days    Types: Cigarettes   Smokeless tobacco: Never  Vaping Use   Vaping status: Never Used  Substance and Sexual Activity   Alcohol use: Yes    Comment: occasional   Drug use: Yes    Types: Marijuana    Comment: RECENTLY   Sexual activity: Not Currently  Other Topics Concern   Not on file  Social  History Narrative   Not on file   Social Drivers of Health   Financial Resource Strain: Not on file  Food Insecurity: Not on file  Transportation Needs: Not on file  Physical Activity: Not on file  Stress: Not on file  Social Connections: Not on file  Intimate Partner Violence: Not on file    Family History  Problem Relation Age of Onset   Breast cancer Cousin 62       maternal   Breast cancer Maternal Aunt 58   Lupus Other    Pancreatic cancer Other    Hypertension Other    Diabetes Other    Cancer Mother     Allergies  Allergen Reactions   Atorvastatin Other (See Comments)    Peripheral neuropathy    Outpatient Medications Prior to Visit  Medication Sig   albuterol  (VENTOLIN  HFA) 108 (90 Base) MCG/ACT inhaler Inhale 2 puffs into the lungs every 6 (six) hours as needed for wheezing or shortness of breath.   cholecalciferol (VITAMIN D3) 10 MCG (400 UNIT) TABS tablet Take 400 Units by mouth daily.   citalopram  (CELEXA ) 40 MG tablet Take 1 tablet (40 mg total) by mouth at bedtime.   benzonatate  (TESSALON  PERLES) 100 MG capsule Take 1 capsule (100 mg total) by mouth  3 (three) times daily as needed for cough. (Patient not taking: Reported on 08/29/2024)   ibuprofen  (ADVIL ) 600 MG tablet Take 600 mg by mouth as needed for headache or moderate pain. (Patient not taking: Reported on 08/29/2024)   methylPREDNISolone  (MEDROL  DOSEPAK) 4 MG TBPK tablet As directed (Patient not taking: Reported on 08/29/2024)   ondansetron  (ZOFRAN ) 4 MG tablet Take 1 tablet (4 mg total) by mouth every 6 (six) hours. (Patient not taking: Reported on 08/29/2024)   [DISCONTINUED] Aspirin-Acetaminophen -Caffeine (GOODYS EXTRA STRENGTH PO) Take 2 packets by mouth as needed (headaches). (Patient not taking: Reported on 08/29/2024)   No facility-administered medications prior to visit.    Review of Systems  Constitutional: Negative.  Negative for weight loss (gained 3 lbs).  HENT:  Positive for sore throat.  Negative for ear pain.   Eyes: Negative.   Respiratory:  Positive for cough, hemoptysis, sputum production and wheezing. Negative for shortness of breath.   Cardiovascular: Negative.  Negative for chest pain.  Gastrointestinal: Negative.  Negative for abdominal pain, constipation and diarrhea.  Genitourinary: Negative.   Musculoskeletal:  Negative for joint pain and myalgias.  Skin: Negative.   Neurological:  Positive for headaches. Negative for dizziness.  Endo/Heme/Allergies: Negative.   All other systems reviewed and are negative.      Objective:   BP 122/66   Pulse (!) 51   Temp 97.8 F (36.6 C)   Ht 5' 5 (1.651 m)   Wt 156 lb 12.8 oz (71.1 kg)   LMP  (LMP Unknown)   SpO2 98%   BMI 26.09 kg/m   Vitals:   08/29/24 1350  BP: 122/66  Pulse: (!) 51  Temp: 97.8 F (36.6 C)  Height: 5' 5 (1.651 m)  Weight: 156 lb 12.8 oz (71.1 kg)  SpO2: 98%  BMI (Calculated): 26.09    Physical Exam   No results found for any visits on 08/29/24.  Recent Results (from the past 2160 hours)  CBC with Diff     Status: Abnormal   Collection Time: 08/04/24  9:27 AM  Result Value Ref Range   WBC 4.7 3.4 - 10.8 x10E3/uL   RBC 4.45 3.77 - 5.28 x10E6/uL   Hemoglobin 13.1 11.1 - 15.9 g/dL   Hematocrit 58.8 65.9 - 46.6 %   MCV 92 79 - 97 fL   MCH 29.4 26.6 - 33.0 pg   MCHC 31.9 31.5 - 35.7 g/dL   RDW 88.7 (L) 88.2 - 84.5 %   Platelets 230 150 - 450 x10E3/uL   Neutrophils 50 Not Estab. %   Lymphs 36 Not Estab. %   Monocytes 8 Not Estab. %   Eos 5 Not Estab. %   Basos 1 Not Estab. %   Neutrophils Absolute 2.4 1.4 - 7.0 x10E3/uL   Lymphocytes Absolute 1.7 0.7 - 3.1 x10E3/uL   Monocytes Absolute 0.4 0.1 - 0.9 x10E3/uL   EOS (ABSOLUTE) 0.2 0.0 - 0.4 x10E3/uL   Basophils Absolute 0.1 0.0 - 0.2 x10E3/uL   Immature Granulocytes 0 Not Estab. %   Immature Grans (Abs) 0.0 0.0 - 0.1 x10E3/uL  Vitamin B12     Status: None   Collection Time: 08/04/24  9:27 AM  Result Value Ref Range    Vitamin B-12 702 232 - 1,245 pg/mL  Vitamin D  (25 hydroxy)     Status: None   Collection Time: 08/04/24  9:27 AM  Result Value Ref Range   Vit D, 25-Hydroxy 44.9 30.0 - 100.0 ng/mL    Comment:  Vitamin D  deficiency has been defined by the Institute of Medicine and an Endocrine Society practice guideline as a level of serum 25-OH vitamin D  less than 20 ng/mL (1,2). The Endocrine Society went on to further define vitamin D  insufficiency as a level between 21 and 29 ng/mL (2). 1. IOM (Institute of Medicine). 2010. Dietary reference    intakes for calcium  and D. [place]: The    [employer]. 2. Holick MF, Binkley [place], Bischoff-Ferrari HA, et al.    Evaluation, treatment, and prevention of vitamin D     deficiency: an Endocrine Society clinical practice    guideline. JCEM. 2011 Jul; 96(7):1911-30.   Lipid panel     Status: Abnormal   Collection Time: 08/04/24  9:27 AM  Result Value Ref Range   Cholesterol, Total 231 (H) 100 - 199 mg/dL   Triglycerides 65 0 - 149 mg/dL   HDL 57 >60 mg/dL   VLDL Cholesterol Cal 11 5 - 40 mg/dL   LDL Chol Calc (NIH) 836 (H) 0 - 99 mg/dL   Chol/HDL Ratio 4.1 0.0 - 4.4 ratio    Comment:                                   T. Chol/HDL Ratio                                             Men  Women                               1/2 Avg.Risk  3.4    3.3                                   Avg.Risk  5.0    4.4                                2X Avg.Risk  9.6    7.1                                3X Avg.Risk 23.4   11.0   CMP14+EGFR     Status: None   Collection Time: 08/04/24  9:27 AM  Result Value Ref Range   Glucose 87 70 - 99 mg/dL   BUN 10 8 - 27 mg/dL   Creatinine, Ser 9.15 0.57 - 1.00 mg/dL   eGFR 79 >40 fO/fpw/8.26   BUN/Creatinine Ratio 12 12 - 28   Sodium 140 134 - 144 mmol/L   Potassium 4.9 3.5 - 5.2 mmol/L   Chloride 104 96 - 106 mmol/L   CO2 24 20 - 29 mmol/L   Calcium  9.8 8.7 - 10.3 mg/dL   Total Protein 6.8 6.0 - 8.5 g/dL    Albumin 4.2 3.9 - 4.9 g/dL   Globulin, Total 2.6 1.5 - 4.5 g/dL   Bilirubin Total 0.7 0.0 - 1.2 mg/dL   Alkaline Phosphatase 60 44 - 121 IU/L    Comment: **Effective August 07, 2024 Alkaline Phosphatase**   reference interval will be changing to:  Age                Female          Female           0 -  5 days         47 - 127       47 - 127           6 - 10 days         29 - 242       29 - 242          11 - 20 days        109 - 357      109 - 357          21 - 30 days         94 - 494       94 - 494           1 -  2 months      149 - 539      149 - 539           3 -  6 months      131 - 452      131 - 452           7 - 11 months      117 - 401      117 - 401   12 months -  6 years       158 - 369      158 - 369           7 - 12 years       150 - 409      150 - 409               13 years       156 - 435       78 - 227               14 years       114 - 375       64 - 161               15 years        88 - 279       56 - 134               16 years        74 - 207       51 - 121               17 years        63 - 161       47 - 113          18 - 20 years        51 - 125       42 - 106          21 - 50 years         47 - 123       41 - 116          51 - 80 years        49 - 135       51 - 125              >80 years  48 - 129       48 - 129    AST 13 0 - 40 IU/L   ALT 9 0 - 32 IU/L  TSH     Status: None   Collection Time: 08/04/24  9:27 AM  Result Value Ref Range   TSH 1.200 0.450 - 4.500 uIU/mL  Hemoglobin A1c     Status: None   Collection Time: 08/04/24  9:27 AM  Result Value Ref Range   Hgb A1c MFr Bld 4.8 4.8 - 5.6 %    Comment:          Prediabetes: 5.7 - 6.4          Diabetes: >6.4          Glycemic control for adults with diabetes: <7.0    Est. average glucose Bld gHb Est-mCnc 91 mg/dL  Lipase, blood     Status: None   Collection Time: 08/06/24  1:05 PM  Result Value Ref Range   Lipase 22 11 - 51 U/L    Comment: Performed at [facility],  [address] [place], [place] [address]  Comprehensive metabolic panel     Status: Abnormal   Collection Time: 08/06/24  1:05 PM  Result Value Ref Range   Sodium 135 135 - 145 mmol/L   Potassium 3.8 3.5 - 5.1 mmol/L   Chloride 102 98 - 111 mmol/L   CO2 23 22 - 32 mmol/L   Glucose, Bld 107 (H) 70 - 99 mg/dL    Comment: Glucose reference range applies only to samples taken after fasting for at least 8 hours.   BUN 7 (L) 8 - 23 mg/dL   Creatinine, Ser 9.15 0.44 - 1.00 mg/dL   Calcium  9.4 8.9 - 10.3 mg/dL   Total Protein 7.5 6.5 - 8.1 g/dL   Albumin 3.8 3.5 - 5.0 g/dL   AST 20 15 - 41 U/L   ALT 15 0 - 44 U/L   Alkaline Phosphatase 49 38 - 126 U/L   Total Bilirubin 0.9 0.0 - 1.2 mg/dL   GFR, Estimated >39 >39 mL/min    Comment: (NOTE) Calculated using the CKD-EPI Creatinine Equation (2021)    Anion gap 10 5 - 15    Comment: Performed at [facility], [address] [place], [place] [address]  CBC     Status: None   Collection Time: 08/06/24  1:05 PM  Result Value Ref Range   WBC 6.3 4.0 - 10.5 K/uL   RBC 4.58 3.87 - 5.11 MIL/uL   Hemoglobin 13.4 12.0 - 15.0 g/dL   HCT 59.3 63.9 - 53.9 %   MCV 88.6 80.0 - 100.0 fL   MCH 29.3 26.0 - 34.0 pg   MCHC 33.0 30.0 - 36.0 g/dL   RDW 88.3 88.4 - 84.4 %   Platelets 202 150 - 400 K/uL   nRBC 0.0 0.0 - 0.2 %    Comment: Performed at [facility], [address] [place], [place] [address]  Urinalysis, Routine w reflex microscopic -Urine, Clean Catch     Status: Abnormal   Collection Time: 08/06/24  1:05 PM  Result Value Ref Range   Color, Urine YELLOW YELLOW   APPearance CLEAR CLEAR   Specific Gravity, Urine 1.015 1.005 - 1.030   pH 6.0 5.0 - 8.0   Glucose, UA NEGATIVE NEGATIVE mg/dL   Hgb urine dipstick NEGATIVE NEGATIVE   Bilirubin Urine NEGATIVE NEGATIVE   Ketones, ur 20 (A) NEGATIVE  mg/dL   Protein, ur NEGATIVE NEGATIVE mg/dL   Nitrite NEGATIVE NEGATIVE   Leukocytes,Ua NEGATIVE NEGATIVE    Comment: Performed at [facility], [address], [place], [place] [address]  Resp panel by RT-PCR (RSV, Flu A&B, Covid) Anterior Nasal Swab     Status: Abnormal   Collection Time: 08/06/24  1:07 PM   Specimen: Anterior Nasal Swab  Result Value Ref Range   SARS Coronavirus 2 by RT PCR POSITIVE (A) NEGATIVE   Influenza A by PCR NEGATIVE NEGATIVE   Influenza B by PCR NEGATIVE NEGATIVE    Comment: (NOTE) The Xpert Xpress SARS-CoV-2/FLU/RSV plus assay is intended as an aid in the diagnosis of influenza from Nasopharyngeal swab specimens and should not be used as a sole basis for treatment. Nasal washings and aspirates are unacceptable for Xpert Xpress SARS-CoV-2/FLU/RSV testing.  Fact Sheet for Patients: BloggerCourse.com  Fact Sheet for Healthcare Providers: SeriousBroker.it  This test is not yet approved or cleared by the [place]  FDA and has been authorized for detection and/or diagnosis of SARS-CoV-2 by FDA under an Emergency Use Authorization (EUA). This EUA will remain in effect (meaning this test can be used) for the duration of the COVID-19 declaration under Section 564(b)(1) of the Act, 21 U.Laqueta Bonaventura.C. section 360bbb-3(b)(1), unless the authorization is terminated or revoked.     Resp Syncytial Virus by PCR NEGATIVE NEGATIVE    Comment: (NOTE) Fact Sheet for Patients: BloggerCourse.com  Fact Sheet for Healthcare Providers: SeriousBroker.it  This test is not yet approved or cleared by the [place]  FDA and has been authorized for detection and/or diagnosis of SARS-CoV-2 by FDA under an Emergency Use Authorization (EUA). This EUA will remain in effect (meaning this test can be used) for the duration of the COVID-19 declaration under Section 564(b)(1) of the Act, 21 U.Milly Goggins.C. section 360bbb-3(b)(1), unless the authorization is terminated or revoked.  Performed at [facility], [address], [place], [place] [address]       Assessment & Plan:  Davie was seen today for establish care.  Mixed hyperlipidemia -     Nexletol ; Take 1 tablet (180 mg total) by mouth daily in the afternoon.  Dispense: 90 tablet; Refill: 0 -     Comprehensive metabolic panel with GFR -     Lipid panel  Prediabetes  Essential hypertension -     Varicella-zoster vaccine IM    Problem List Items Addressed This Visit       Other   Mixed hyperlipidemia - Primary   Relevant Medications   Bempedoic Acid  (NEXLETOL ) 180 MG TABS   Other Relevant Orders   Comprehensive metabolic panel with GFR   Lipid panel   Other Visit Diagnoses       Prediabetes         Essential hypertension       Relevant Medications   Bempedoic Acid  (NEXLETOL ) 180 MG TABS   Other Relevant Orders   Zoster Recombinant (Shingrix )       Return in about 3 months (around 11/29/2024) for fu with labs prior.   Total time spent: 40 minutes  Sherrill Cinderella Perry, MD  08/29/2024   This document may have been prepared by [facility] Voice Recognition software and as such may include unintentional dictation errors.

## 2024-11-21 ENCOUNTER — Other Ambulatory Visit: Payer: Self-pay | Admitting: Cardiology

## 2024-12-05 ENCOUNTER — Ambulatory Visit: Payer: PRIVATE HEALTH INSURANCE | Admitting: Internal Medicine

## 2024-12-08 ENCOUNTER — Other Ambulatory Visit: Payer: PRIVATE HEALTH INSURANCE

## 2024-12-09 LAB — COMPREHENSIVE METABOLIC PANEL WITH GFR
ALT: 17 IU/L (ref 0–32)
AST: 20 IU/L (ref 0–40)
Albumin: 4.4 g/dL (ref 3.9–4.9)
Alkaline Phosphatase: 58 IU/L (ref 49–135)
BUN/Creatinine Ratio: 20 (ref 12–28)
BUN: 16 mg/dL (ref 8–27)
Bilirubin Total: 0.3 mg/dL (ref 0.0–1.2)
CO2: 22 mmol/L (ref 20–29)
Calcium: 10 mg/dL (ref 8.7–10.3)
Chloride: 102 mmol/L (ref 96–106)
Creatinine, Ser: 0.8 mg/dL (ref 0.57–1.00)
Globulin, Total: 2.7 g/dL (ref 1.5–4.5)
Glucose: 95 mg/dL (ref 70–99)
Potassium: 4.2 mmol/L (ref 3.5–5.2)
Sodium: 138 mmol/L (ref 134–144)
Total Protein: 7.1 g/dL (ref 6.0–8.5)
eGFR: 83 mL/min/1.73

## 2024-12-09 LAB — LIPID PANEL
Chol/HDL Ratio: 2.9 ratio (ref 0.0–4.4)
Cholesterol, Total: 166 mg/dL (ref 100–199)
HDL: 58 mg/dL
LDL Chol Calc (NIH): 98 mg/dL (ref 0–99)
Triglycerides: 49 mg/dL (ref 0–149)
VLDL Cholesterol Cal: 10 mg/dL (ref 5–40)

## 2024-12-13 ENCOUNTER — Ambulatory Visit: Payer: Self-pay | Admitting: Internal Medicine

## 2024-12-13 ENCOUNTER — Ambulatory Visit: Payer: PRIVATE HEALTH INSURANCE | Admitting: Internal Medicine

## 2024-12-13 VITALS — BP 112/76 | HR 57 | Temp 98.1°F | Ht 65.0 in | Wt 155.8 lb

## 2024-12-13 DIAGNOSIS — I10 Essential (primary) hypertension: Secondary | ICD-10-CM | POA: Diagnosis not present

## 2024-12-13 DIAGNOSIS — F3289 Other specified depressive episodes: Secondary | ICD-10-CM

## 2024-12-13 DIAGNOSIS — R7303 Prediabetes: Secondary | ICD-10-CM | POA: Diagnosis not present

## 2024-12-13 DIAGNOSIS — E782 Mixed hyperlipidemia: Secondary | ICD-10-CM | POA: Diagnosis not present

## 2024-12-13 MED ORDER — NEXLETOL 180 MG PO TABS: 180.0000 mg | ORAL_TABLET | Freq: Every day | ORAL | 0 refills | Status: AC

## 2024-12-13 NOTE — Progress Notes (Signed)
 "  Established Patient Office Visit  Subjective:  Patient ID: Melanie Newton, female    DOB: [birth year]  Age: 64 y.o. MRN: [id number]  Chief Complaint  Patient presents with   Follow-up    3 month lab results    No new complaints, here for lab review and medication refills. Labs reviewed and notable for normalization of blood sugar and marked improvement in lipids which are now normal across the board. Mood is stable and would like to be weaned off Citalopram .    No other concerns at this time.   Past Medical History:  Diagnosis Date   Allergic rhinitis    Anxiety    Asthma    COVID-19 virus infection 11/2020   Depression    Fibroids 2002   GERD (gastroesophageal reflux disease)    High cholesterol    Hyperlipidemia    Hypothyroidism    Overweight (BMI 25.0-29.9)    Overweight (BMI 25.0-29.9)    Prediabetes 03/26/2023   Vitamin D  deficiency     Past Surgical History:  Procedure Laterality Date   ABDOMINAL HYSTERECTOMY     COLONOSCOPY     COLONOSCOPY WITH PROPOFOL  N/A 01/11/2024   Procedure: COLONOSCOPY WITH PROPOFOL ;  Surgeon: Maryruth Ole DASEN, MD;  Location: [facility];  Service: Endoscopy;  Laterality: N/A;   ECTOPIC PREGNANCY SURGERY  2000   Laparotomy   ROBOTIC ASSISTED TOTAL HYSTERECTOMY Right 10/25/2020   Procedure: XI ROBOTIC ASSISTED TOTAL HYSTERECTOMY and bilateral salpingectomy; Vaginal hysterectomy;  Surgeon: Connell Davies, MD;  Location: [facility];  Service: Gynecology;  Laterality: Right;   UTERINE FIBROID SURGERY     X2    Social History   Socioeconomic History   Marital status: Married    Spouse name: Not on file   Number of children: Not on file   Years of education: Not on file   Highest education level: Not on file  Occupational History   Not on file  Tobacco Use   Smoking status: Some Days    Types: Cigarettes   Smokeless tobacco: Never  Vaping Use   Vaping status: Never Used  Substance and Sexual Activity   Alcohol use:  Yes    Comment: occasional   Drug use: Yes    Types: Marijuana    Comment: RECENTLY   Sexual activity: Not Currently  Other Topics Concern   Not on file  Social History Narrative   Not on file   Social Drivers of Health   Tobacco Use: High Risk (08/06/2024)   Patient History    Smoking Tobacco Use: Some Days    Smokeless Tobacco Use: Never    Passive Exposure: Not on file  Financial Resource Strain: Not on file  Food Insecurity: Not on file  Transportation Needs: Not on file  Physical Activity: Not on file  Stress: Not on file  Social Connections: Not on file  Intimate Partner Violence: Not on file  Depression (EYV7-0): Not on file  Alcohol Screen: Not on file  Housing: Not on file  Utilities: Not on file  Health Literacy: Not on file    Family History  Problem Relation Age of Onset   Breast cancer Cousin 48       maternal   Breast cancer Maternal Aunt 58   Lupus Other    Pancreatic cancer Other    Hypertension Other    Diabetes Other    Cancer Mother     Allergies[1]  Show/hide medication list[2]  Review of Systems  Constitutional:  Positive  for weight loss (2 lbs).       Objective:   BP 112/76   Pulse (!) 57   Temp 98.1 F (36.7 C)   Ht 5' 5 (1.651 m)   Wt 155 lb 12.8 oz (70.7 kg)   LMP  (LMP Unknown)   SpO2 96%   BMI 25.93 kg/m   Vitals:   12/13/24 0952  BP: 112/76  Pulse: (!) 57  Temp: 98.1 F (36.7 C)  Height: 5' 5 (1.651 m)  Weight: 155 lb 12.8 oz (70.7 kg)  SpO2: 96%  BMI (Calculated): 25.93    Physical Exam   No results found for any visits on 12/13/24.  Recent Results (from the past 2160 hours)  Comprehensive metabolic panel with GFR     Status: None   Collection Time: 12/08/24 10:07 AM  Result Value Ref Range   Glucose 95 70 - 99 mg/dL   BUN 16 8 - 27 mg/dL   Creatinine, Ser 9.19 0.57 - 1.00 mg/dL   eGFR 83 >40 fO/fpw/8.26   BUN/Creatinine Ratio 20 12 - 28   Sodium 138 134 - 144 mmol/L   Potassium 4.2 3.5 - 5.2  mmol/L   Chloride 102 96 - 106 mmol/L   CO2 22 20 - 29 mmol/L   Calcium  10.0 8.7 - 10.3 mg/dL   Total Protein 7.1 6.0 - 8.5 g/dL   Albumin 4.4 3.9 - 4.9 g/dL   Globulin, Total 2.7 1.5 - 4.5 g/dL   Bilirubin Total 0.3 0.0 - 1.2 mg/dL   Alkaline Phosphatase 58 49 - 135 IU/L   AST 20 0 - 40 IU/L   ALT 17 0 - 32 IU/L  Lipid panel     Status: None   Collection Time: 12/08/24 10:07 AM  Result Value Ref Range   Cholesterol, Total 166 100 - 199 mg/dL   Triglycerides 49 0 - 149 mg/dL   HDL 58 >60 mg/dL   VLDL Cholesterol Cal 10 5 - 40 mg/dL   LDL Chol Calc (NIH) 98 0 - 99 mg/dL   Chol/HDL Ratio 2.9 0.0 - 4.4 ratio    Comment:                                   T. Chol/HDL Ratio                                             Men  Women                               1/2 Avg.Risk  3.4    3.3                                   Avg.Risk  5.0    4.4                                2X Avg.Risk  9.6    7.1                                3X Avg.Risk  23.4   11.0       Assessment & Plan:  Melanie Newton was seen today for follow-up.  Mixed hyperlipidemia  Prediabetes  Essential hypertension  Other depression   Taper Celexa  by taking one every other day x 2 wks, then twice a week for a week then once a week for a week then stop. Problem List Items Addressed This Visit       Other   Mixed hyperlipidemia - Primary   Relevant Medications   Bempedoic Acid  (NEXLETOL ) 180 MG TABS   Other Visit Diagnoses       Prediabetes         Essential hypertension       Relevant Medications   Bempedoic Acid  (NEXLETOL ) 180 MG TABS     Other depression           Return in about 3 months (around 03/13/2025) for fu with labs prior.   Total time spent: 20 minutes. This time includes review of previous notes and results and patient face to face interaction during today'Rubert Frediani visit.    Sherrill Cinderella Perry, MD  12/13/2024   This document may have been prepared by [facility] Voice Recognition software and as  such may include unintentional dictation errors.     [1]  Allergies Allergen Reactions   Atorvastatin Other (See Comments)    Peripheral neuropathy  [2]  Outpatient Medications Prior to Visit  Medication Sig   albuterol  (VENTOLIN  HFA) 108 (90 Base) MCG/ACT inhaler Inhale 2 puffs into the lungs every 6 (six) hours as needed for wheezing or shortness of breath.   Bempedoic Acid  (NEXLETOL ) 180 MG TABS Take 180 mg by mouth daily.   citalopram  (CELEXA ) 40 MG tablet TAKE 1 TABLET BY MOUTH EVERYDAY AT BEDTIME   cholecalciferol (VITAMIN D3) 10 MCG (400 UNIT) TABS tablet Take 400 Units by mouth daily. (Patient not taking: Reported on 12/13/2024)   ondansetron  (ZOFRAN ) 4 MG tablet Take 1 tablet (4 mg total) by mouth every 6 (six) hours. (Patient not taking: Reported on 12/13/2024)   [DISCONTINUED] benzonatate  (TESSALON  PERLES) 100 MG capsule Take 1 capsule (100 mg total) by mouth 3 (three) times daily as needed for cough. (Patient not taking: Reported on 12/13/2024)   [DISCONTINUED] ibuprofen  (ADVIL ) 600 MG tablet Take 600 mg by mouth as needed for headache or moderate pain. (Patient not taking: Reported on 12/13/2024)   [DISCONTINUED] methylPREDNISolone  (MEDROL  DOSEPAK) 4 MG TBPK tablet As directed (Patient not taking: Reported on 12/13/2024)   No facility-administered medications prior to visit.   "

## 2024-12-26 ENCOUNTER — Other Ambulatory Visit: Payer: Self-pay

## 2024-12-26 MED ORDER — CITALOPRAM HYDROBROMIDE 40 MG PO TABS: 40.0000 mg | ORAL_TABLET | Freq: Once | ORAL | 3 refills | Status: AC

## 2025-03-13 ENCOUNTER — Ambulatory Visit: Payer: PRIVATE HEALTH INSURANCE | Admitting: Internal Medicine
